# Patient Record
Sex: Male | Born: 1935 | Race: White | Hispanic: No | Marital: Married | Smoking: Former smoker
Health system: Southern US, Community
[De-identification: ages and names within clinical notes are randomized; demographics above are authoritative.]

## PROBLEM LIST (undated history)

## (undated) DIAGNOSIS — D649 Anemia, unspecified: Secondary | ICD-10-CM

## (undated) DIAGNOSIS — I2510 Atherosclerotic heart disease of native coronary artery without angina pectoris: Secondary | ICD-10-CM

## (undated) DIAGNOSIS — I10 Essential (primary) hypertension: Secondary | ICD-10-CM

## (undated) DIAGNOSIS — H409 Unspecified glaucoma: Secondary | ICD-10-CM

## (undated) DIAGNOSIS — I219 Acute myocardial infarction, unspecified: Secondary | ICD-10-CM

## (undated) DIAGNOSIS — I509 Heart failure, unspecified: Secondary | ICD-10-CM

## (undated) DIAGNOSIS — Z972 Presence of dental prosthetic device (complete) (partial): Secondary | ICD-10-CM

## (undated) DIAGNOSIS — C801 Malignant (primary) neoplasm, unspecified: Secondary | ICD-10-CM

## (undated) DIAGNOSIS — J449 Chronic obstructive pulmonary disease, unspecified: Secondary | ICD-10-CM

## (undated) DIAGNOSIS — I499 Cardiac arrhythmia, unspecified: Secondary | ICD-10-CM

## (undated) DIAGNOSIS — K219 Gastro-esophageal reflux disease without esophagitis: Secondary | ICD-10-CM

## (undated) HISTORY — PX: CORONARY ANGIOPLASTY: SHX604

## (undated) HISTORY — PX: TONSILLECTOMY: SUR1361

## (undated) HISTORY — DX: Chronic obstructive pulmonary disease, unspecified: J44.9

---

## 2012-04-16 ENCOUNTER — Other Ambulatory Visit: Payer: Self-pay | Admitting: Ophthalmology

## 2012-04-17 ENCOUNTER — Ambulatory Visit: Payer: Self-pay | Admitting: Ophthalmology

## 2012-05-22 ENCOUNTER — Inpatient Hospital Stay: Payer: Self-pay

## 2012-05-22 LAB — DIFFERENTIAL
Comment - H1-Com1: NORMAL
Comment - H1-Com2: NORMAL
Lymphocytes: 23 %
Segmented Neutrophils: 63 %

## 2012-05-22 LAB — BASIC METABOLIC PANEL
Anion Gap: 6 — ABNORMAL LOW (ref 7–16)
Calcium, Total: 8.9 mg/dL (ref 8.5–10.1)
Co2: 26 mmol/L (ref 21–32)
EGFR (African American): >60
EGFR (Non-African Amer.): >60
Glucose: 138 mg/dL — ABNORMAL HIGH (ref 65–99)
Osmolality: 284 (ref 275–301)
Potassium: 3.6 mmol/L (ref 3.5–5.1)

## 2012-05-22 LAB — PRO B NATRIURETIC PEPTIDE: B-Type Natriuretic Peptide: 2205 pg/mL — ABNORMAL HIGH (ref 0–450)

## 2012-05-22 LAB — CBC
HCT: 37.4 % — ABNORMAL LOW (ref 40.0–52.0)
HGB: 12.2 g/dL — ABNORMAL LOW (ref 13.0–18.0)
MCH: 31 pg (ref 26.0–34.0)
Platelet: 300 10*3/uL (ref 150–440)
RBC: 3.95 10*6/uL — ABNORMAL LOW (ref 4.40–5.90)
RDW: 12.8 % (ref 11.5–14.5)
WBC: 21 10*3/uL — ABNORMAL HIGH (ref 3.8–10.6)

## 2012-05-22 LAB — TROPONIN I: Troponin-I: <0.02 ng/mL

## 2012-05-23 LAB — CBC WITH DIFFERENTIAL/PLATELET
Basophil #: 0 10*3/uL (ref 0.0–0.1)
Basophil %: 0.2 %
Eosinophil #: 0.2 10*3/uL (ref 0.0–0.7)
HCT: 34 % — ABNORMAL LOW (ref 40.0–52.0)
Lymphocyte %: 17.9 %
MCHC: 34.7 g/dL (ref 32.0–36.0)
Monocyte %: 11.4 %
Neutrophil %: 69.2 %
Platelet: 260 10*3/uL (ref 150–440)
RDW: 12.9 % (ref 11.5–14.5)
WBC: 16.5 10*3/uL — ABNORMAL HIGH (ref 3.8–10.6)

## 2012-05-23 LAB — LIPID PANEL
HDL Cholesterol: 31 mg/dL — ABNORMAL LOW (ref 40–60)
Ldl Cholesterol, Calc: 55 mg/dL (ref 0–100)
Triglycerides: 193 mg/dL (ref 0–200)
VLDL Cholesterol, Calc: 39 mg/dL (ref 5–40)

## 2012-05-23 LAB — BASIC METABOLIC PANEL
Anion Gap: 8 (ref 7–16)
BUN: 21 mg/dL — ABNORMAL HIGH (ref 7–18)
Chloride: 102 mmol/L (ref 98–107)
Co2: 28 mmol/L (ref 21–32)
Creatinine: 1.14 mg/dL (ref 0.60–1.30)
EGFR (African American): >60
Osmolality: 279 (ref 275–301)
Potassium: 3.3 mmol/L — ABNORMAL LOW (ref 3.5–5.1)

## 2012-05-23 LAB — CK TOTAL AND CKMB (NOT AT ARMC)
CK, Total: 32 U/L — ABNORMAL LOW (ref 35–232)
CK-MB: 0.6 ng/mL (ref 0.5–3.6)
CK-MB: 0.6 ng/mL (ref 0.5–3.6)

## 2012-05-24 LAB — BASIC METABOLIC PANEL
Anion Gap: 13 (ref 7–16)
BUN: 14 mg/dL (ref 7–18)
Creatinine: 1.06 mg/dL (ref 0.60–1.30)
EGFR (African American): >60
EGFR (Non-African Amer.): >60
Glucose: 114 mg/dL — ABNORMAL HIGH (ref 65–99)
Osmolality: 275 (ref 275–301)
Potassium: 3.2 mmol/L — ABNORMAL LOW (ref 3.5–5.1)

## 2012-05-28 LAB — CULTURE, BLOOD (SINGLE)

## 2012-06-02 ENCOUNTER — Ambulatory Visit: Payer: Self-pay | Admitting: Internal Medicine

## 2012-06-10 DIAGNOSIS — H409 Unspecified glaucoma: Secondary | ICD-10-CM | POA: Insufficient documentation

## 2014-05-22 ENCOUNTER — Observation Stay: Payer: Self-pay | Admitting: Internal Medicine

## 2014-05-22 LAB — COMPREHENSIVE METABOLIC PANEL
ALT: 37 U/L (ref 12–78)
Albumin: 3.7 g/dL (ref 3.4–5.0)
Alkaline Phosphatase: 97 U/L
Anion Gap: 7 (ref 7–16)
BUN: 12 mg/dL (ref 7–18)
Bilirubin,Total: 0.4 mg/dL (ref 0.2–1.0)
CALCIUM: 9.3 mg/dL (ref 8.5–10.1)
CO2: 26 mmol/L (ref 21–32)
CREATININE: 1.06 mg/dL (ref 0.60–1.30)
Chloride: 107 mmol/L (ref 98–107)
EGFR (Non-African Amer.): >60
Glucose: 102 mg/dL — ABNORMAL HIGH (ref 65–99)
OSMOLALITY: 279 (ref 275–301)
Potassium: 3.5 mmol/L (ref 3.5–5.1)
SGOT(AST): 36 U/L (ref 15–37)
Sodium: 140 mmol/L (ref 136–145)
Total Protein: 8.5 g/dL — ABNORMAL HIGH (ref 6.4–8.2)

## 2014-05-22 LAB — CBC
HCT: 42.4 % (ref 40.0–52.0)
HGB: 14 g/dL (ref 13.0–18.0)
MCH: 31.1 pg (ref 26.0–34.0)
MCHC: 33 g/dL (ref 32.0–36.0)
MCV: 94 fL (ref 80–100)
Platelet: 264 10*3/uL (ref 150–440)
RBC: 4.5 10*6/uL (ref 4.40–5.90)
RDW: 13.4 % (ref 11.5–14.5)
WBC: 12.2 10*3/uL — AB (ref 3.8–10.6)

## 2014-05-22 LAB — PROTIME-INR
INR: 1
Prothrombin Time: 13.1 secs (ref 11.5–14.7)

## 2014-05-22 LAB — CK-MB
CK-MB: 0.9 ng/mL (ref 0.5–3.6)
CK-MB: 0.9 ng/mL (ref 0.5–3.6)

## 2014-05-22 LAB — TROPONIN I
Troponin-I: 0.04 ng/mL
Troponin-I: 0.05 ng/mL

## 2014-05-22 LAB — PRO B NATRIURETIC PEPTIDE: B-TYPE NATIURETIC PEPTID: 104 pg/mL (ref 0–450)

## 2014-05-23 LAB — LIPID PANEL
CHOLESTEROL: 136 mg/dL (ref 0–200)
HDL Cholesterol: 24 mg/dL — ABNORMAL LOW (ref 40–60)
Triglycerides: 400 mg/dL — ABNORMAL HIGH (ref 0–200)

## 2014-05-23 LAB — TROPONIN I: Troponin-I: 0.05 ng/mL

## 2014-05-23 LAB — CK-MB: CK-MB: 0.8 ng/mL (ref 0.5–3.6)

## 2014-05-24 LAB — CBC WITH DIFFERENTIAL/PLATELET
BASOS PCT: 0.6 %
Basophil #: 0.1 10*3/uL (ref 0.0–0.1)
Eosinophil #: 0.4 10*3/uL (ref 0.0–0.7)
Eosinophil %: 3.2 %
HCT: 41.8 % (ref 40.0–52.0)
HGB: 14.2 g/dL (ref 13.0–18.0)
Lymphocyte #: 5.2 10*3/uL — ABNORMAL HIGH (ref 1.0–3.6)
Lymphocyte %: 42.7 %
MCH: 31.3 pg (ref 26.0–34.0)
MCHC: 34 g/dL (ref 32.0–36.0)
MCV: 92 fL (ref 80–100)
MONO ABS: 1.1 x10 3/mm — AB (ref 0.2–1.0)
Monocyte %: 9 %
NEUTROS ABS: 5.4 10*3/uL (ref 1.4–6.5)
NEUTROS PCT: 44.5 %
PLATELETS: 241 10*3/uL (ref 150–440)
RBC: 4.54 10*6/uL (ref 4.40–5.90)
RDW: 13 % (ref 11.5–14.5)
WBC: 12.2 10*3/uL — AB (ref 3.8–10.6)

## 2014-05-24 LAB — BASIC METABOLIC PANEL
Anion Gap: 10 (ref 7–16)
BUN: 17 mg/dL (ref 7–18)
Calcium, Total: 8.9 mg/dL (ref 8.5–10.1)
Chloride: 104 mmol/L (ref 98–107)
Co2: 24 mmol/L (ref 21–32)
Creatinine: 1.13 mg/dL (ref 0.60–1.30)
EGFR (Non-African Amer.): >60
GLUCOSE: 94 mg/dL (ref 65–99)
Osmolality: 277 (ref 275–301)
Potassium: 3.5 mmol/L (ref 3.5–5.1)
SODIUM: 138 mmol/L (ref 136–145)

## 2014-05-24 LAB — CK-MB: CK-MB: 1.5 ng/mL (ref 0.5–3.6)

## 2014-05-25 LAB — BASIC METABOLIC PANEL
Anion Gap: 9 (ref 7–16)
BUN: 20 mg/dL — ABNORMAL HIGH (ref 7–18)
CALCIUM: 9 mg/dL (ref 8.5–10.1)
CHLORIDE: 104 mmol/L (ref 98–107)
Co2: 25 mmol/L (ref 21–32)
Creatinine: 1.21 mg/dL (ref 0.60–1.30)
GFR CALC NON AF AMER: 57 — AB
GLUCOSE: 99 mg/dL (ref 65–99)
Osmolality: 278 (ref 275–301)
POTASSIUM: 3.5 mmol/L (ref 3.5–5.1)
Sodium: 138 mmol/L (ref 136–145)

## 2015-03-12 NOTE — H&P (Signed)
PATIENT NAME:  Derek Haney, Derek Haney MR#:  [id number] DATE OF BIRTH:  August 08, 1933  DATE OF ADMISSION:  05/22/2014  REFERRING PHYSICIAN: Dr. Cinda Quest  PRIMARY CARE PHYSICIAN: Emily Filbert, [facility].   CHIEF COMPLAINT: Chest pain.   HISTORY OF PRESENT ILLNESS: A 81 year old Caucasian gentleman with history of coronary artery disease with prior myocardial infarction, in fact, approximately 30 years ago. He states he had balloon angioplasty, however, no stent placement, no bypass surgery, as well as history of hypertension, presenting with chest pain. Describes three day duration intermittent chest pain, retrosternal in location and burning in quality, 7 to 8 out of 10 in intensity, nonradiating. No worsening or relieving factors. Associated with minimal shortness of breath described as dyspnea on exertion. No nausea, vomiting, further symptomatology. Currently, he is chest pain free without complaints.   REVIEW OF SYSTEMS:  CONSTITUTIONAL: Denies fevers, chills, fatigue, weakness.  EYES: Denies blurred vision, double vision, eye pain.  EARS, NOSE, THROAT: Denies tinnitus, ear pain, hearing loss.  RESPIRATORY: Denies cough, wheeze. Positive for shortness of breath as described above.  CARDIOVASCULAR: Positive for chest pain as described above. Denies any orthopnea, edema, palpitations.  GASTROINTESTINAL: Denies nausea, vomiting, diarrhea, abdominal pain.  GENITOURINARY: Denies dysuria, hematuria.  ENDOCRINE: Denies nocturia or thyroid problems.  HEMATOLOGIC AND LYMPHATIC: Denies easy bruising, bleeding.  SKIN: Denies rash or lesion.  MUSCULOSKELETAL: Denies pain in neck, back, shoulder, knees, hips or arthritic symptoms.  NEUROLOGIC: Denies paralysis, paresthesias.  PSYCHIATRIC: Denies anxiety or depressive symptoms.  Otherwise, full review of systems performed by me and is negative.   PAST MEDICAL HISTORY: Hypertension, coronary artery disease, with history of myocardial infarction about 34  years ago, treated only with balloon angioplasty, no stent placement, no bypass procedures, as well as lumbago.   SOCIAL HISTORY: Remote tobacco use. Denies any alcohol, drug use.   FAMILY HISTORY: Positive for coronary artery disease as well as lymphoma.   ALLERGIES: PENICILLIN.   HOME MEDICATIONS: Include nitroglycerin 0.4 mg sublingual every five minutes as needed for chest pain, propranolol 40 mg p.o. 4 times daily, trazodone 50 mg 2 tablets p.o. at bedtime as needed for sleep, nifedipine 10 mg 2 capsules p.o. 4 times daily, Lasix 40 mg p.o. daily, Nasonex 50 mcg inhalation two sprays once daily, dorzolamide 2% ophthalmic solution one drop to each eye daily, Lumigan 0.01% ophthalmic solution one drop to each eye twice daily.    PHYSICAL EXAMINATION: VITAL SIGNS: Temperature 98.2, heart rate 65, respirations 18, blood pressure 151/80, saturating 95 on room air. Weight 90.7 kg, BMI 32.3.  GENERAL: Well-nourished, well-developed, Caucasian gentleman, currently in no acute distress.  HEAD: Normocephalic, atraumatic.  EYES: Pupils equal, round, reactive to light. Extraocular muscles are intact. No scleral icterus.  MOUTH: Moist mucous membranes. Dentition intact. No abscess noted.  EARS, NOSE, AND THROAT: Clear, without exudates. No external lesions.  NECK: Supple. No thyromegaly. No nodules. No JVD.  PULMONARY: Clear to auscultation No wheezes, rubs or rhonchi. No use of accessory muscles. Good respiratory effort.  CHEST: Nontender to palpation.  CARDIOVASCULAR: S1, S2. Regular rate and rhythm. No murmurs, rubs, or gallops. No edema. Pedal pulses two 2+ bilaterally.  GASTROINTESTINAL: Soft, nontender, nondistended. No masses. Positive bowel sounds. No hepatosplenomegaly. MUSCULOSKELETAL: No swelling, clubbing, or edema. Range of motion full in all extremities.  NEUROLOGIC: Cranial nerves II to XII intact. No gross focal neurological deficits. Sensation intact. Reflexes intact.  SKIN: No  ulceration, lesions, rashes, or cyanosis. Skin warm, dry. Turgor intact.  PSYCHIATRIC: Mood and  affect within normal limits. The patient is alert and oriented x3. Insight and judgment intact.   LABORATORY DATA: EKG performed, revealing right bundle branch block with anterior T-wave inversion, which is unchanged from prior EKG performed back in 2013. Chest x-ray: No acute cardiopulmonary process. Sodium 140, potassium 3.5, chloride 107, bicarbonate 26, BUN 12, creatinine 1.06, glucose 102, total protein 8.5. Otherwise LFTs within normal limits. Troponin I is 0.04. WBC 12.2, hemoglobin 14, platelets of 264.   ASSESSMENT AND PLAN: A 81 year old gentleman with history of coronary artery disease, presenting with chest pain.  1.  Chest pain. Admit to telemetry under observational status. Trend cardiac enzymes x3. Initiate aspirin and statin therapy. This is possibly gastrointestinal in etiology. We will start a proton pump inhibitor as well.  2.  Leukocytosis. No evidence of infection. No antibiotic required at this time. 3.  Hypertension. Continue home doses of nifedipine and propranolol.  4.  Venous thromboembolism prophylaxis with heparin subcutaneous.   CODE STATUS: The patient is full code.   TIME SPENT: 45 minutes.   ____________________________ Aaron Mose. Ayahna Solazzo, MD dkh:cg D: 05/22/2014 20:54:21 ET T: 05/22/2014 23:12:01 ET JOB#: [id number]  cc: Aaron Mose. Larya Charpentier, MD, <Dictator> Ayanah Snader Woodfin Ganja MD ELECTRONICALLY SIGNED 05/25/2014 1:48

## 2015-03-12 NOTE — Consult Note (Signed)
PATIENT NAME:  Derek Haney, Derek Haney MR#:  [id number] DATE OF BIRTH:  08-30-1933  DATE OF CONSULTATION:  05/23/2014  REFERRING PHYSICIAN:  Dr. Ether Griffins CONSULTING PHYSICIAN:  Corey Skains, MD  REASON FOR CONSULTATION: Unstable angina.  HISTORY OF PRESENT ILLNESS: This is a 81 year old male with known coronary disease status post previous coronary artery stenting, old myocardial infarction with evidence of significant substernal chest pain waxing and waning over the last 3-4 days, relieved by nitroglycerin, associated with shortness of breath, weakness, and fatigue. He was seen in the Emergency Room with an EKG of normal sinus rhythm right bundle branch block, but no evidence of myocardial infarction with troponin being normal. He does have some other borderline hypertension, hyperlipidemia, appropriately treated. The remainder of review of systems negative for vision change, ringing in the ears, hearing loss, cough, congestion, heartburn, nausea, vomiting, diarrhea, bloody stools, stomach pain, extremity pain, leg weakness, cramping of the buttocks, known blood clots, headaches, blackouts, dizzy spells, nosebleeds, congestion, trouble swallowing, frequent urination, urination at night, muscle weakness, numbness, anxiety, depression, skin lesions, skin rash.   PAST MEDICAL HISTORY:  1. Old myocardial infarction. 2. Hypertension.  3. Hyperlipidemia.  4. Coronary artery disease.   FAMILY HISTORY: There are multiple family members with early onset of cardiovascular disease or hypertension.   SOCIAL HISTORY: He currently denies alcohol or tobacco use.   ALLERGIES: HE HAS ALLERGIES TO MEDICATIONS AS LISTED.   PHYSICAL EXAMINATION:  VITAL SIGNS: Blood pressure is 144/68 bilaterally. Heart rate 72 upright, reclining, and regular.  GENERAL: He is a well-appearing male in no acute distress.   HEENT: No icterus, thyromegaly, ulcers, hemorrhage, or xanthelasma.  CARDIOVASCULAR: Regular rate and rhythm.  Normal S1 and S2 without murmur, gallop, or rub. PMI is diffuse. Carotid upstroke normal without bruit. Jugular venous pressure is normal.  LUNGS: Clear to auscultation with normal respirations.  ABDOMEN: Soft, nontender without hepatosplenomegaly or masses. Abdominal aorta is normal size without bruit.  EXTREMITIES:  Show 2+ bilateral pulses in dorsal, pedal, radial, and femoral arteries without lower extremity edema, cyanosis, clubbing or ulcers.  NEUROLOGICAL: He is oriented to time, place, and person with normal mood and affect.   ASSESSMENT: Elderly male with known coronary artery disease, previous myocardial infarction, hypertension, hyperlipidemia with unstable angina, and abnormal EKG.   RECOMMENDATIONS:  1. Continue serial ECG and enzymes to assess for a possible myocardial infarction.  2. Continue nitroglycerin, heparin as needed for cardiovascular risk reduction.  3. Further consideration of cardiac catheterization to assess coronary anatomy and further treatment thereof as necessary. The patient understands the risks and benefits of cardiac catheterization. This includes possibility of death, stroke, heart attack, infection, bleeding or blood clot. He is at low risk for conscious sedation.    ____________________________ Corey Skains, MD bjk:dd D: 05/23/2014 22:30:34 ET T: 05/24/2014 02:31:21 ET JOB#: [id number]  cc: Corey Skains, MD, <Dictator> Corey Skains MD ELECTRONICALLY SIGNED 05/24/2014 13:23

## 2015-03-12 NOTE — Discharge Summary (Signed)
PATIENT NAME:  Derek Haney, Derek Haney MR#:  [id number] DATE OF BIRTH:  05-Apr-1933  DATE OF ADMISSION:  05/22/2014 DATE OF DISCHARGE:  05/25/2014  DISCHARGE DIAGNOSES: 1.  Unstable angina.  2.  Hypertension.  3.  Coronary artery disease.  4.  Glaucoma.   DISCHARGE MEDICATIONS: 1.  Trazodone 50 mg at bedtime. 2.  Dorzolamide 2% ophthalmic 1 drop both eyes b.i.d. 3.  Lumigan 0.01% one drop both eyes at bedtime. 4.  Restasis 0.05% one drop both eyes b.i.d. 5.  Propranolol 40 mg half tab q.i.d.  6.  Lasix 40 mg half tab daily. 7.  Aspirin 81 mg daily. 8.  Atorvastatin 20 mg at bedtime.  9.  Ticagrelor 90 mg b.i.d. 10.  Temazepam 15 mg at bedtime p.r.n.  11.  Nifedipine 20 mg q.i.d.  REASON FOR ADMISSION: A 81 year old male who presents with chest pain. Please see H and P for HPI, past medical history and physical exam.   HOSPITAL COURSE: The patient was admitted, was found to have typical angina and underwent heart catheterization showing 100% RCA with collateralization and a proximal circumflex as well as a proximal LAD lesion. The LAD underwent PCI without complication. He is asymptomatic at this point and will follow up with Dr. Nehemiah Massed and Dr. Sabra Heck in 2 weeks. He will need cardiac rehab at that point.   ____________________________ Rusty Aus, MD mfm:sb D: 05/25/2014 07:35:06 ET T: 05/25/2014 08:41:26 ET JOB#: [id number]  cc: Rusty Aus, MD, <Dictator> Marlana Mckowen Roselee Culver MD ELECTRONICALLY SIGNED 05/26/2014 7:05

## 2015-03-13 NOTE — Discharge Summary (Signed)
PATIENT NAME:  Derek Haney, Derek Haney MR#:  [id number] DATE OF BIRTH:  1933-01-04  DATE OF ADMISSION:  05/22/2012 DATE OF DISCHARGE:  05/24/2012  PRIMARY CARE PROVIDER: Emily Filbert, MD   DISCHARGE DIAGNOSES:  1. Diastolic congestive heart failure.  2. Chronic sinusitis.  3. Hypertension.  4. Chronic back pain.   HISTORY OF PRESENT ILLNESS: This is a 81 year old male with a history of hypertension who presented to the ED with increasing shortness of breath. He had been treated with a prednisone taper as well as one course of antibiotics for chronic sinusitis. Despite this therapy, shortness of breath had increased. He was found to have mild hypoxia which improved with oxygen supplementation. He had an abnormal chest x-ray suggestive of pulmonary vascular congestion and an elevated BNP. He was admitted with a diagnosis of heart failure.   HOSPITAL COURSE: The patient was treated with IV followed by oral Lasix. His outpatient course of antibiotics was continued. He had previously undergone an echocardiogram showing ejection fraction of 60% so no echo was repeated. He had good symptomatic improvement with the Lasix. Labs were otherwise fairly unremarkable and, of note, his cardiac enzymes remained normal. A repeat BNP on the day of discharge had improved to just 271.   DISCHARGE MEDICATIONS:  1. Clarithromycin 500 mg twice daily for 21 days, started 05/16/2012.  2. Dorzolamide 2% ophthalmic drops one GTT OU daily.  3. Lumigan 0.01% ophthalmic drops one GTT OU b.i.d.  4. Nasonex 50 mcg two sprays daily.  5. Nifedipine 10 mg 2 capsules q.i.d.  6. Nitroglycerin 0.4 mg sublingual as needed for chest pain.  7. Propranolol 40 mg q.i.d.  8. Trazodone 50 mg 2 tabs at bedtime.  9. Furosemide 20 mg daily (new medication).   DISCHARGE INSTRUCTIONS:  1. The patient should be followed up with Dr. Emily Filbert within the next week. He will be called with a date and time of his appointment.  2. The patient should  weigh himself daily and notify his physician if his weight increases by more than 2 pounds in one day or more than 5 pounds in one week.   ____________________________ A. Lavone Orn, MD ams:drc D: 05/24/2012 09:35:23 ET T: 05/26/2012 11:07:07 ET JOB#: [id number]  cc: A. Lavone Orn, MD, <Dictator> Rusty Aus, MD Gracy Bruins SOLUM MD ELECTRONICALLY SIGNED 05/28/2012 12:45

## 2015-03-13 NOTE — H&P (Signed)
PATIENT NAME:  Derek Haney, Derek Haney MR#:  [id number] DATE OF BIRTH:  [birth year]  DATE OF ADMISSION:  05/22/2012  PRIMARY CARE PHYSICIAN: Dr. Emily Filbert  CHIEF COMPLAINT: Increasing shortness of breath.   HISTORY OF PRESENT ILLNESS: Derek Haney is a pleasant 81 year old Caucasian gentleman with past medical history of hypertension, chronic low back pain comes to the Emergency Room after he started noticing increasing shortness of breath for past 3 to 4 weeks. Initially was thought he has chronic sinusitis. Saw Dr. Nadeen Landau 06/28 and was started on clarithromycin to complete a 21 day course. Patient is still on clarithromycin. He just finished a prednisone taper as part of the chronic sinusitis treatment. Patient continues to have increasing shortness of breath while at home, increased fatigability and comes to the Emergency Room where he was found to have acute congestive heart failure, suspected diastolic, new onset. He is being admitted for further infection and management. Patient denies any chest pain, any fever or any productive cough. He denies any dysuria or any abdominal pain. He received IV Lasix 20 mg.  PAST MEDICAL HISTORY: 1. Chronic low back pain.  2. Hypertension.  3. Questionable MI in the 44s.   PAST SURGICAL HISTORY: Tonsillectomy and adenoidectomy.    ALLERGIES: Penicillin.   MEDICATIONS:  1. Clarithromycin 500 mg twice a day for 21 days, started 05/16/2012 by Dr. Loletta Specter.  2. Dorzolamide 2% ophthalmic drops one drop to each eye daily.  3. Lumigan 0.01% one drop to each eye b.i.d.  4. Nasonex 50 mcg two sprays once a day in the evening.  5. Nifedipine 10 mg 2 capsules 4 times a day.  6. Nitroglycerin 0.4 mg one tablet p.r.n. for chest pain.  7. Propranolol 40 mg 4 times a day.  8. Trazodone 50 mg 2 tablets at bedtime.   FAMILY HISTORY: Father died of lymph node cancer.   SOCIAL HISTORY: Married, has three boys. Retired from [employer]. Stopped smoking in  the 80s. Chews tobacco now.    REVIEW OF SYSTEMS: CONSTITUTIONAL: No fever. Positive fatigue, weakness. EYES: No blurred or double vision. Positive for glaucoma. ENT: Positive for chronic sinusitis. No ear pain or ear discharge. RESPIRATORY: Positive for shortness of breath. No chronic obstructive pulmonary disease or pneumonia. CARDIOVASCULAR: No chest pain. Positive for edema. Positive for dyspnea on exertion and blood pressure. GASTROINTESTINAL: No nausea, vomiting, diarrhea, abdominal pain or gastroesophageal reflux disease. GENITOURINARY: No dysuria, hematuria. ENDOCRINE: No polyuria or nocturia. HEMATOLOGY: No anemia or easy bruising. SKIN: No acne, rash. MUSCULOSKELETAL: Positive for arthritis. NEUROLOGIC: No cerebrovascular accident, transient ischemic attack. PSYCH: No anxiety or depression. All other systems reviewed and negative.   PHYSICAL EXAMINATION:  GENERAL: Patient is awake, alert, oriented x3, not in acute distress.   VITAL SIGNS: Afebrile, pulse 71, blood pressure 128/63, sats 99% on 2 liters.   HEENT: Atraumatic, normocephalic. Pupils are equal, round, and reactive to light and accommodation. Extraocular movements intact. Oral mucosa is moist.   NECK: Supple. No JVD. No carotid bruit.   RESPIRATORY: Decreased breath sounds in the bases. No rales, rhonchi, respiratory distress, or labored breathing.   CARDIOVASCULAR: Both the heart sounds are normal. PMI not lateralized. Chest nontender.   EXTREMITIES: Good pedal pulses, good femoral pulses. No lower extremity edema.   ABDOMEN: Obese, soft, nontender. No organomegaly. Positive bowel sounds.   NEUROLOGIC: Grossly intact cranial nerves II through XII. No motor or sensory deficits.   PSYCH: Patient is awake, alert, oriented x3.   LABORATORY, DIAGNOSTIC AND  RADIOLOGICAL DATA: EKG shows normal sinus rhythm. Chest x-ray consistent with pulmonary vascular congestion. B-type natriuretic peptide 2200. Cardiac enzymes, first set,  negative. White count 21,000, hemoglobin and hematocrit 12 and 37.4, platelet count normal. Metabolic panel within normal limits.   ASSESSMENT AND PLAN: 81 year old Derek Haney with:  1. New onset congestive heart failure, acute, diastolic. Outpatient echo done July 2013 shows ejection fraction of 60% with elevated RV pressure. Patient will be admitted under Dr. Sabra Heck on 2A. 2 grams sodium diet. Will continue IV Lasix b.i.d., ins and outs, daily weights. Continue rest of home medication. Consider cardiology consultation if needed. Cycle cardiac enzymes x3.  2. Chronic sinusitis. Just finished a prednisone taper. Currently on clarithromycin for 21 days. Will continue clarithromycin.  3. Hypertension. Patient takes nifedipine and propranolol which I will continue for now.  4. History of myocardial infarction in the past. Denies any chest pain. First set of cardiac enzymes are negative. Will cycle two more cardiac enzymes.  5. Glaucoma. Continue Lumigan and dorzolamide.  6. Leukocytosis, appears reactive. Patient recently also on steroids. He does not have any source of infection, however, if persistent further work-up needs to be done. Patient already is on clarithromycin as well.  7. CODE STATUS: Patient is a FULL CODE.  8. Further work-up according to patient's clinical course. Hospital admission plan was discussed with the patient and wife.   TIME SPENT: 50 minutes.   ____________________________ Derek Rochester Posey Pronto, MD sap:cms D: 05/22/2012 20:43:16 ET T: 05/23/2012 09:16:06 ET JOB#: [id number]  cc: Donaciano Range A. Posey Pronto, MD, <Dictator> Rusty Aus, MD Ilda Basset MD ELECTRONICALLY SIGNED 05/23/2012 17:13

## 2015-04-15 DIAGNOSIS — I10 Essential (primary) hypertension: Secondary | ICD-10-CM | POA: Insufficient documentation

## 2016-03-05 ENCOUNTER — Other Ambulatory Visit: Payer: Self-pay | Admitting: Orthopedic Surgery

## 2016-03-07 ENCOUNTER — Encounter
Admission: RE | Admit: 2016-03-07 | Discharge: 2016-03-07 | Disposition: A | Discharge status: Home / Self Care | Payer: Medicare Other | Source: Ambulatory Visit | Attending: Orthopedic Surgery | Admitting: Orthopedic Surgery

## 2016-03-07 DIAGNOSIS — Z01812 Encounter for preprocedural laboratory examination: Secondary | ICD-10-CM | POA: Insufficient documentation

## 2016-03-07 HISTORY — DX: Heart failure, unspecified: I50.9

## 2016-03-07 HISTORY — DX: Anemia, unspecified: D64.9

## 2016-03-07 HISTORY — DX: Cardiac arrhythmia, unspecified: I49.9

## 2016-03-07 HISTORY — DX: Malignant (primary) neoplasm, unspecified: C80.1

## 2016-03-07 HISTORY — DX: Essential (primary) hypertension: I10

## 2016-03-07 HISTORY — DX: Gastro-esophageal reflux disease without esophagitis: K21.9

## 2016-03-07 HISTORY — DX: Unspecified glaucoma: H40.9

## 2016-03-07 HISTORY — DX: Atherosclerotic heart disease of native coronary artery without angina pectoris: I25.10

## 2016-03-07 HISTORY — DX: Acute myocardial infarction, unspecified: I21.9

## 2016-03-07 LAB — COMPREHENSIVE METABOLIC PANEL
ALBUMIN: 4.4 g/dL (ref 3.5–5.0)
ALK PHOS: 110 U/L (ref 38–126)
ALT: 18 U/L (ref 17–63)
ANION GAP: 8 (ref 5–15)
AST: 23 U/L (ref 15–41)
BILIRUBIN TOTAL: 1.1 mg/dL (ref 0.3–1.2)
BUN: 17 mg/dL (ref 6–20)
CALCIUM: 9.7 mg/dL (ref 8.9–10.3)
CO2: 29 mmol/L (ref 22–32)
Chloride: 101 mmol/L (ref 101–111)
Creatinine, Ser: 1.36 mg/dL — ABNORMAL HIGH (ref 0.61–1.24)
GFR calc Af Amer: 55 mL/min — ABNORMAL LOW (ref >60)
GFR, EST NON AFRICAN AMERICAN: 47 mL/min — AB (ref >60)
GLUCOSE: 106 mg/dL — AB (ref 65–99)
Potassium: 4 mmol/L (ref 3.5–5.1)
Sodium: 138 mmol/L (ref 135–145)
TOTAL PROTEIN: 8.3 g/dL — AB (ref 6.5–8.1)

## 2016-03-07 LAB — URINALYSIS COMPLETE WITH MICROSCOPIC (ARMC ONLY)
BILIRUBIN URINE: NEGATIVE
Bacteria, UA: NONE SEEN
GLUCOSE, UA: NEGATIVE mg/dL
HGB URINE DIPSTICK: NEGATIVE
Ketones, ur: NEGATIVE mg/dL
LEUKOCYTES UA: NEGATIVE
Nitrite: NEGATIVE
Protein, ur: NEGATIVE mg/dL
Specific Gravity, Urine: 1.017 (ref 1.005–1.030)
pH: 5 (ref 5.0–8.0)

## 2016-03-07 LAB — CBC
HEMATOCRIT: 38.1 % — AB (ref 40.0–52.0)
HEMOGLOBIN: 13 g/dL (ref 13.0–18.0)
MCH: 30.1 pg (ref 26.0–34.0)
MCHC: 34.2 g/dL (ref 32.0–36.0)
MCV: 88 fL (ref 80.0–100.0)
Platelets: 250 10*3/uL (ref 150–440)
RBC: 4.33 MIL/uL — ABNORMAL LOW (ref 4.40–5.90)
RDW: 14.3 % (ref 11.5–14.5)
WBC: 9.5 10*3/uL (ref 3.8–10.6)

## 2016-03-07 LAB — TYPE AND SCREEN
ABO/RH(D): B POS
Antibody Screen: NEGATIVE

## 2016-03-07 LAB — PROTIME-INR
INR: 1.07
PROTHROMBIN TIME: 14.1 s (ref 11.4–15.0)

## 2016-03-07 LAB — SURGICAL PCR SCREEN
MRSA, PCR: POSITIVE — AB
STAPHYLOCOCCUS AUREUS: POSITIVE — AB

## 2016-03-07 LAB — APTT: APTT: 30 s (ref 24–36)

## 2016-03-07 LAB — SEDIMENTATION RATE: SED RATE: 35 mm/h — AB (ref 0–20)

## 2016-03-07 NOTE — Patient Instructions (Signed)
  Your procedure is scheduled on:Mar 21, 2016 (Wednesday) Report to Day Surgery.([facility]) SECOND FLOOR To find out your arrival time please call [phone] between 1PM - 3PM on Mar 20, 2016 (Tuesday).  Remember: Instructions that are not followed completely may result in serious medical risk, up to and including death, or upon the discretion of your surgeon and anesthesiologist your surgery may need to be rescheduled.    __x__ 1. Do not eat food or drink liquids after midnight. No gum chewing or hard candies.     __x__ 2. No Alcohol for 24 hours before or after surgery.   ____ 3. Bring all medications with you on the day of surgery if instructed.    __x__ 4. Notify your doctor if there is any change in your medical condition     (cold, fever, infections).     Do not wear jewelry, make-up, hairpins, clips or nail polish.  Do not wear lotions, powders, or perfumes. You may wear deodorant.  Do not shave 48 hours prior to surgery. Men may shave face and neck.  Do not bring valuables to the hospital.    [facility] is not responsible for any belongings or valuables.               Contacts, dentures or bridgework may not be worn into surgery.  Leave your suitcase in the car. After surgery it may be brought to your room.  For patients admitted to the hospital, discharge time is determined by your                treatment team.   Patients discharged the day of surgery will not be allowed to drive home.   Please read over the following fact sheets that you were given:   MRSA Information and Surgical Site Infection Prevention   ____ Take these medicines the morning of surgery with A SIP OF WATER:    1. Isosorbide Mon  2. Propranolol  3. Pantoprazole (Pantoprazole at bedtime on May 2)  4.  5.  6.  ____ Fleet Enema (as directed)   _x___ Use CHG Soap as directed  ____ Use inhalers on the day of surgery  ____ Stop metformin 2 days prior to surgery    ____ Take 1/2 of usual  insulin dose the night before surgery and none on the morning of surgery.   __X__ Stop Coumadin/Plavix/aspirin on (ASK DR [place] ON Monday (April  24) AT OFFICE VISIT  __X__ Stop Anti-inflammatories on (NO NSAIDS)  TYLENOL OK TO TAKE FOR PAIN IF NEEDED)   __X_ Stop supplements until after surgery.  (STOP GLUCOSAMINE- CHONDROIT NOW)  ____ Bring C-Pap to the hospital.

## 2016-03-08 LAB — ABO/RH: ABO/RH(D): B POS

## 2016-03-08 NOTE — Pre-Procedure Instructions (Signed)
MRSA results faxed and called to Dr. Marry Guan office (spoke to [place]).

## 2016-03-09 LAB — URINE CULTURE: Special Requests: NORMAL

## 2016-03-19 NOTE — Pre-Procedure Instructions (Signed)
CLEARED BY DR Nehemiah Massed 03/12/16

## 2016-03-21 ENCOUNTER — Encounter: Payer: Self-pay | Admitting: *Deleted

## 2016-03-21 ENCOUNTER — Inpatient Hospital Stay: Payer: Medicare Other | Admitting: Anesthesiology

## 2016-03-21 ENCOUNTER — Inpatient Hospital Stay
Admission: RE | Admit: 2016-03-21 | Discharge: 2016-03-23 | DRG: 470 | Disposition: A | Discharge status: Skilled Nursing Facility | Payer: Medicare Other | Source: Ambulatory Visit | Attending: Orthopedic Surgery | Admitting: Orthopedic Surgery

## 2016-03-21 ENCOUNTER — Inpatient Hospital Stay: Payer: Medicare Other

## 2016-03-21 ENCOUNTER — Encounter
Admission: RE | Disposition: A | Discharge status: Skilled Nursing Facility | Payer: Self-pay | Source: Ambulatory Visit | Attending: Orthopedic Surgery

## 2016-03-21 DIAGNOSIS — K219 Gastro-esophageal reflux disease without esophagitis: Secondary | ICD-10-CM | POA: Diagnosis present

## 2016-03-21 DIAGNOSIS — M1611 Unilateral primary osteoarthritis, right hip: Secondary | ICD-10-CM | POA: Diagnosis present

## 2016-03-21 DIAGNOSIS — Z79899 Other long term (current) drug therapy: Secondary | ICD-10-CM

## 2016-03-21 DIAGNOSIS — Z880 Allergy status to penicillin: Secondary | ICD-10-CM

## 2016-03-21 DIAGNOSIS — I252 Old myocardial infarction: Secondary | ICD-10-CM | POA: Diagnosis not present

## 2016-03-21 DIAGNOSIS — J449 Chronic obstructive pulmonary disease, unspecified: Secondary | ICD-10-CM | POA: Diagnosis present

## 2016-03-21 DIAGNOSIS — I2510 Atherosclerotic heart disease of native coronary artery without angina pectoris: Secondary | ICD-10-CM | POA: Diagnosis present

## 2016-03-21 DIAGNOSIS — Z886 Allergy status to analgesic agent status: Secondary | ICD-10-CM | POA: Diagnosis not present

## 2016-03-21 DIAGNOSIS — Z7982 Long term (current) use of aspirin: Secondary | ICD-10-CM | POA: Diagnosis not present

## 2016-03-21 DIAGNOSIS — I110 Hypertensive heart disease with heart failure: Secondary | ICD-10-CM | POA: Diagnosis present

## 2016-03-21 DIAGNOSIS — H409 Unspecified glaucoma: Secondary | ICD-10-CM | POA: Diagnosis present

## 2016-03-21 DIAGNOSIS — I509 Heart failure, unspecified: Secondary | ICD-10-CM | POA: Diagnosis present

## 2016-03-21 DIAGNOSIS — Z96649 Presence of unspecified artificial hip joint: Secondary | ICD-10-CM

## 2016-03-21 HISTORY — PX: TOTAL HIP ARTHROPLASTY: SHX124

## 2016-03-21 SURGERY — ARTHROPLASTY, HIP, TOTAL,POSTERIOR APPROACH
Anesthesia: Spinal | Laterality: Right | Wound class: Clean

## 2016-03-21 MED ORDER — FENTANYL CITRATE (PF) 100 MCG/2ML IJ SOLN
INTRAMUSCULAR | Status: DC | PRN
  Administered 2016-03-21 (×2): 50 ug via INTRAVENOUS

## 2016-03-21 MED ORDER — FLEET ENEMA 7-19 GM/118ML RE ENEM: 1.0000 | ENEMA | Freq: Once | RECTAL | Status: DC | PRN

## 2016-03-21 MED ORDER — MUPIROCIN 2 % EX OINT
1.0000 "application " | TOPICAL_OINTMENT | Freq: Two times a day (BID) | CUTANEOUS | Status: DC
  Filled 2016-03-21: qty 22

## 2016-03-21 MED ORDER — LACTATED RINGERS IV SOLN
INTRAVENOUS | Status: DC
  Administered 2016-03-21 (×2): via INTRAVENOUS

## 2016-03-21 MED ORDER — FENTANYL CITRATE (PF) 100 MCG/2ML IJ SOLN
25.0000 ug | INTRAMUSCULAR | Status: DC | PRN
  Administered 2016-03-21 (×2): 25 ug via INTRAVENOUS

## 2016-03-21 MED ORDER — VANCOMYCIN HCL IN DEXTROSE 1-5 GM/200ML-% IV SOLN
INTRAVENOUS | Status: AC
  Administered 2016-03-21: 1000 mg via INTRAVENOUS
  Filled 2016-03-21: qty 200

## 2016-03-21 MED ORDER — LATANOPROST 0.005 % OP SOLN
1.0000 [drp] | Freq: Every day | OPHTHALMIC | Status: DC
  Filled 2016-03-21: qty 2.5

## 2016-03-21 MED ORDER — BISACODYL 10 MG RE SUPP
10.0000 mg | Freq: Every day | RECTAL | Status: DC | PRN
  Administered 2016-03-23: 10 mg via RECTAL
  Filled 2016-03-21: qty 1

## 2016-03-21 MED ORDER — ONDANSETRON HCL 4 MG/2ML IJ SOLN: 4.0000 mg | Freq: Four times a day (QID) | INTRAMUSCULAR | Status: DC | PRN

## 2016-03-21 MED ORDER — DIPHENHYDRAMINE HCL 12.5 MG/5ML PO ELIX: 12.5000 mg | ORAL_SOLUTION | ORAL | Status: DC | PRN

## 2016-03-21 MED ORDER — POTASSIUM CHLORIDE CRYS ER 20 MEQ PO TBCR
20.0000 meq | EXTENDED_RELEASE_TABLET | Freq: Every day | ORAL | Status: DC
  Administered 2016-03-21 – 2016-03-23 (×3): 20 meq via ORAL
  Filled 2016-03-21 (×3): qty 1

## 2016-03-21 MED ORDER — METOCLOPRAMIDE HCL 10 MG PO TABS
10.0000 mg | ORAL_TABLET | Freq: Three times a day (TID) | ORAL | Status: DC
  Administered 2016-03-21 – 2016-03-23 (×6): 10 mg via ORAL
  Filled 2016-03-21 (×6): qty 1

## 2016-03-21 MED ORDER — ATORVASTATIN CALCIUM 20 MG PO TABS
20.0000 mg | ORAL_TABLET | Freq: Every day | ORAL | Status: DC
  Administered 2016-03-21: 20 mg via ORAL
  Filled 2016-03-21: qty 1

## 2016-03-21 MED ORDER — TRAMADOL HCL 50 MG PO TABS
50.0000 mg | ORAL_TABLET | ORAL | Status: DC | PRN
  Administered 2016-03-21 – 2016-03-22 (×2): 50 mg via ORAL
  Administered 2016-03-23: 100 mg via ORAL
  Filled 2016-03-21: qty 2
  Filled 2016-03-21 (×2): qty 1

## 2016-03-21 MED ORDER — NEOMYCIN-POLYMYXIN B GU 40-200000 IR SOLN
Status: DC | PRN
  Administered 2016-03-21: 14 mL

## 2016-03-21 MED ORDER — ACETAMINOPHEN 10 MG/ML IV SOLN
INTRAVENOUS | Status: AC
  Filled 2016-03-21: qty 100

## 2016-03-21 MED ORDER — ACETAMINOPHEN 325 MG PO TABS: 650.0000 mg | ORAL_TABLET | Freq: Four times a day (QID) | ORAL | Status: DC | PRN

## 2016-03-21 MED ORDER — SODIUM CHLORIDE FLUSH 0.9 % IV SOLN
INTRAVENOUS | Status: AC
  Filled 2016-03-21: qty 10

## 2016-03-21 MED ORDER — PROPOFOL 500 MG/50ML IV EMUL
INTRAVENOUS | Status: DC | PRN
  Administered 2016-03-21: 50 ug/kg/min via INTRAVENOUS

## 2016-03-21 MED ORDER — DORZOLAMIDE HCL 2 % OP SOLN
1.0000 [drp] | Freq: Two times a day (BID) | OPHTHALMIC | Status: DC
  Filled 2016-03-21: qty 10

## 2016-03-21 MED ORDER — TRAZODONE HCL 100 MG PO TABS
100.0000 mg | ORAL_TABLET | Freq: Every day | ORAL | Status: DC
  Administered 2016-03-21 – 2016-03-22 (×2): 100 mg via ORAL
  Filled 2016-03-21 (×2): qty 1

## 2016-03-21 MED ORDER — OXYCODONE HCL 5 MG PO TABS
5.0000 mg | ORAL_TABLET | ORAL | Status: DC | PRN
  Administered 2016-03-21: 10 mg via ORAL
  Administered 2016-03-21: 5 mg via ORAL
  Administered 2016-03-22 – 2016-03-23 (×2): 10 mg via ORAL
  Filled 2016-03-21 (×3): qty 2
  Filled 2016-03-21: qty 1

## 2016-03-21 MED ORDER — ISOSORBIDE MONONITRATE ER 30 MG PO TB24
30.0000 mg | ORAL_TABLET | Freq: Three times a day (TID) | ORAL | Status: DC
  Administered 2016-03-21 – 2016-03-23 (×4): 30 mg via ORAL
  Filled 2016-03-21 (×6): qty 1

## 2016-03-21 MED ORDER — ACETAMINOPHEN 10 MG/ML IV SOLN
1000.0000 mg | Freq: Four times a day (QID) | INTRAVENOUS | Status: AC
  Administered 2016-03-21 – 2016-03-22 (×4): 1000 mg via INTRAVENOUS
  Filled 2016-03-21 (×4): qty 100

## 2016-03-21 MED ORDER — FUROSEMIDE 20 MG PO TABS
20.0000 mg | ORAL_TABLET | Freq: Every day | ORAL | Status: DC
  Administered 2016-03-21 – 2016-03-23 (×3): 20 mg via ORAL
  Filled 2016-03-21 (×3): qty 1

## 2016-03-21 MED ORDER — MENTHOL 3 MG MT LOZG: 1.0000 | LOZENGE | OROMUCOSAL | Status: DC | PRN

## 2016-03-21 MED ORDER — ALUM & MAG HYDROXIDE-SIMETH 200-200-20 MG/5ML PO SUSP: 30.0000 mL | ORAL | Status: DC | PRN

## 2016-03-21 MED ORDER — BUPIVACAINE HCL (PF) 0.75 % IJ SOLN
INTRAMUSCULAR | Status: DC | PRN
  Administered 2016-03-21: 1.8 mL via INTRATHECAL

## 2016-03-21 MED ORDER — VANCOMYCIN HCL IN DEXTROSE 1-5 GM/200ML-% IV SOLN
1000.0000 mg | Freq: Once | INTRAVENOUS | Status: AC
  Administered 2016-03-21: 1000 mg via INTRAVENOUS

## 2016-03-21 MED ORDER — NEOMYCIN-POLYMYXIN B GU 40-200000 IR SOLN
Status: AC
  Filled 2016-03-21: qty 20

## 2016-03-21 MED ORDER — CLINDAMYCIN PHOSPHATE 900 MG/50ML IV SOLN
900.0000 mg | INTRAVENOUS | Status: AC
  Administered 2016-03-21: 900 mg via INTRAVENOUS

## 2016-03-21 MED ORDER — MIDAZOLAM HCL 5 MG/5ML IJ SOLN
INTRAMUSCULAR | Status: DC | PRN
  Administered 2016-03-21 (×2): 1 mg via INTRAVENOUS

## 2016-03-21 MED ORDER — ENOXAPARIN SODIUM 30 MG/0.3ML ~~LOC~~ SOLN
30.0000 mg | Freq: Two times a day (BID) | SUBCUTANEOUS | Status: DC
  Administered 2016-03-22 – 2016-03-23 (×3): 30 mg via SUBCUTANEOUS
  Filled 2016-03-21 (×3): qty 0.3

## 2016-03-21 MED ORDER — MUPIROCIN 2 % EX OINT
1.0000 "application " | TOPICAL_OINTMENT | Freq: Two times a day (BID) | CUTANEOUS | Status: DC
  Administered 2016-03-22: 1 via NASAL
  Filled 2016-03-21: qty 22

## 2016-03-21 MED ORDER — SODIUM CHLORIDE 0.9 % IV SOLN
INTRAVENOUS | Status: DC
  Administered 2016-03-21 – 2016-03-22 (×2): via INTRAVENOUS

## 2016-03-21 MED ORDER — ONDANSETRON HCL 4 MG/2ML IJ SOLN
INTRAMUSCULAR | Status: DC | PRN
  Administered 2016-03-21: 4 mg via INTRAVENOUS

## 2016-03-21 MED ORDER — EPHEDRINE SULFATE 50 MG/ML IJ SOLN
INTRAMUSCULAR | Status: DC | PRN
  Administered 2016-03-21: 20 mg via INTRAVENOUS
  Administered 2016-03-21 (×2): 10 mg via INTRAVENOUS
  Administered 2016-03-21: 20 mg via INTRAVENOUS

## 2016-03-21 MED ORDER — ONDANSETRON HCL 4 MG PO TABS: 4.0000 mg | ORAL_TABLET | Freq: Four times a day (QID) | ORAL | Status: DC | PRN

## 2016-03-21 MED ORDER — MAGNESIUM HYDROXIDE 400 MG/5ML PO SUSP
30.0000 mL | Freq: Every day | ORAL | Status: DC | PRN
  Administered 2016-03-23: 30 mL via ORAL
  Filled 2016-03-21: qty 30

## 2016-03-21 MED ORDER — FENTANYL CITRATE (PF) 100 MCG/2ML IJ SOLN
INTRAMUSCULAR | Status: AC
  Administered 2016-03-21: 25 ug via INTRAVENOUS
  Filled 2016-03-21: qty 2

## 2016-03-21 MED ORDER — CLINDAMYCIN PHOSPHATE 900 MG/50ML IV SOLN
INTRAVENOUS | Status: AC
  Filled 2016-03-21: qty 50

## 2016-03-21 MED ORDER — CHLORHEXIDINE GLUCONATE 4 % EX LIQD: 60.0000 mL | Freq: Once | CUTANEOUS | Status: DC

## 2016-03-21 MED ORDER — ONDANSETRON HCL 4 MG/2ML IJ SOLN: 4.0000 mg | Freq: Once | INTRAMUSCULAR | Status: DC | PRN

## 2016-03-21 MED ORDER — PANTOPRAZOLE SODIUM 40 MG PO TBEC
40.0000 mg | DELAYED_RELEASE_TABLET | Freq: Two times a day (BID) | ORAL | Status: DC
  Administered 2016-03-21 – 2016-03-23 (×4): 40 mg via ORAL
  Filled 2016-03-21 (×4): qty 1

## 2016-03-21 MED ORDER — FERROUS SULFATE 325 (65 FE) MG PO TABS
325.0000 mg | ORAL_TABLET | Freq: Two times a day (BID) | ORAL | Status: DC
  Administered 2016-03-22 – 2016-03-23 (×2): 325 mg via ORAL
  Filled 2016-03-21 (×2): qty 1

## 2016-03-21 MED ORDER — ACETAMINOPHEN 650 MG RE SUPP: 650.0000 mg | Freq: Four times a day (QID) | RECTAL | Status: DC | PRN

## 2016-03-21 MED ORDER — CHLORHEXIDINE GLUCONATE CLOTH 2 % EX PADS
6.0000 | MEDICATED_PAD | Freq: Every day | CUTANEOUS | Status: DC
  Administered 2016-03-22: 6 via TOPICAL

## 2016-03-21 MED ORDER — SENNOSIDES-DOCUSATE SODIUM 8.6-50 MG PO TABS
1.0000 | ORAL_TABLET | Freq: Two times a day (BID) | ORAL | Status: DC
  Administered 2016-03-21 – 2016-03-23 (×4): 1 via ORAL
  Filled 2016-03-21 (×4): qty 1

## 2016-03-21 MED ORDER — CLINDAMYCIN PHOSPHATE 600 MG/50ML IV SOLN
600.0000 mg | Freq: Four times a day (QID) | INTRAVENOUS | Status: AC
  Administered 2016-03-21 – 2016-03-22 (×4): 600 mg via INTRAVENOUS
  Filled 2016-03-21 (×4): qty 50

## 2016-03-21 MED ORDER — PHENOL 1.4 % MT LIQD: 1.0000 | OROMUCOSAL | Status: DC | PRN

## 2016-03-21 MED ORDER — CYCLOSPORINE 0.05 % OP EMUL
1.0000 [drp] | Freq: Two times a day (BID) | OPHTHALMIC | Status: DC
  Filled 2016-03-21 (×5): qty 1

## 2016-03-21 MED ORDER — MORPHINE SULFATE (PF) 2 MG/ML IV SOLN
2.0000 mg | INTRAVENOUS | Status: DC | PRN
  Administered 2016-03-21: 2 mg via INTRAVENOUS
  Filled 2016-03-21: qty 1

## 2016-03-21 MED ORDER — TETRACAINE HCL 1 % IJ SOLN
INTRAMUSCULAR | Status: DC | PRN
  Administered 2016-03-21: 8 mg via INTRASPINAL

## 2016-03-21 MED ORDER — ACETAMINOPHEN 10 MG/ML IV SOLN
INTRAVENOUS | Status: DC | PRN
  Administered 2016-03-21: 1000 mg via INTRAVENOUS

## 2016-03-21 MED ORDER — CELECOXIB 200 MG PO CAPS
200.0000 mg | ORAL_CAPSULE | Freq: Two times a day (BID) | ORAL | Status: DC
  Administered 2016-03-21 – 2016-03-23 (×4): 200 mg via ORAL
  Filled 2016-03-21 (×4): qty 1

## 2016-03-21 MED ORDER — POLYVINYL ALCOHOL 1.4 % OP SOLN
1.0000 [drp] | Freq: Two times a day (BID) | OPHTHALMIC | Status: DC
  Filled 2016-03-21: qty 15

## 2016-03-21 MED ORDER — PROPRANOLOL HCL 20 MG PO TABS
20.0000 mg | ORAL_TABLET | Freq: Four times a day (QID) | ORAL | Status: DC
  Administered 2016-03-21 – 2016-03-23 (×4): 20 mg via ORAL
  Filled 2016-03-21 (×5): qty 1

## 2016-03-21 SURGICAL SUPPLY — 50 items
BLADE DRUM FLTD (BLADE) ×3 IMPLANT
BLADE SAW 1 (BLADE) ×3 IMPLANT
CANISTER SUCT 1200ML W/VALVE (MISCELLANEOUS) ×3 IMPLANT
CANISTER SUCT 3000ML (MISCELLANEOUS) ×6 IMPLANT
CAPT HIP TOTAL 2 ×3 IMPLANT
CARTRIDGE OIL MAESTRO DRILL (MISCELLANEOUS) ×1 IMPLANT
CATH FOL LEG HOLDER (MISCELLANEOUS) ×3 IMPLANT
CATH TRAY METER 16FR LF (MISCELLANEOUS) ×3 IMPLANT
DIFFUSER MAESTRO (MISCELLANEOUS) ×3 IMPLANT
DRAPE INCISE IOBAN 66X60 STRL (DRAPES) ×3 IMPLANT
DRAPE SHEET LG 3/4 BI-LAMINATE (DRAPES) ×3 IMPLANT
DRSG DERMACEA 8X12 NADH (GAUZE/BANDAGES/DRESSINGS) ×3 IMPLANT
DRSG OPSITE POSTOP 3X4 (GAUZE/BANDAGES/DRESSINGS) ×3 IMPLANT
DRSG OPSITE POSTOP 4X12 (GAUZE/BANDAGES/DRESSINGS) IMPLANT
DRSG OPSITE POSTOP 4X14 (GAUZE/BANDAGES/DRESSINGS) ×3 IMPLANT
DRSG TEGADERM 4X4.75 (GAUZE/BANDAGES/DRESSINGS) ×3 IMPLANT
DURAPREP 26ML APPLICATOR (WOUND CARE) ×6 IMPLANT
ELECT BLADE 6.5 EXT (BLADE) ×3 IMPLANT
ELECT CAUTERY BLADE 6.4 (BLADE) ×3 IMPLANT
GLOVE BIOGEL M STRL SZ7.5 (GLOVE) ×3 IMPLANT
GLOVE INDICATOR 8.0 STRL GRN (GLOVE) ×3 IMPLANT
GLOVE SURG 9.0 ORTHO LTXF (GLOVE) ×6 IMPLANT
GLOVE SURG ORTHO 9.0 STRL STRW (GLOVE) ×3 IMPLANT
GOWN STRL REUS W/ TWL LRG LVL3 (GOWN DISPOSABLE) ×2 IMPLANT
GOWN STRL REUS W/TWL 2XL LVL3 (GOWN DISPOSABLE) ×3 IMPLANT
GOWN STRL REUS W/TWL LRG LVL3 (GOWN DISPOSABLE) ×4
HANDPIECE SUCTION TUBG SURGILV (MISCELLANEOUS) ×3 IMPLANT
HEMOVAC 400CC 10FR (MISCELLANEOUS) ×3 IMPLANT
HOOD PEEL AWAY FLYTE STAYCOOL (MISCELLANEOUS) ×6 IMPLANT
KIT RM TURNOVER STRD PROC AR (KITS) ×3 IMPLANT
NDL SAFETY 18GX1.5 (NEEDLE) ×3 IMPLANT
NS IRRIG 500ML POUR BTL (IV SOLUTION) ×3 IMPLANT
OIL CARTRIDGE MAESTRO DRILL (MISCELLANEOUS) ×3
PACK HIP PROSTHESIS (MISCELLANEOUS) ×3 IMPLANT
PIN STEIN THRED 5/32 (Pin) ×3 IMPLANT
SOL .9 NS 3000ML IRR  AL (IV SOLUTION) ×2
SOL .9 NS 3000ML IRR UROMATIC (IV SOLUTION) ×1 IMPLANT
SOL PREP PVP 2OZ (MISCELLANEOUS) ×3
SOLUTION PREP PVP 2OZ (MISCELLANEOUS) ×1 IMPLANT
SPONGE DRAIN TRACH 4X4 STRL 2S (GAUZE/BANDAGES/DRESSINGS) ×3 IMPLANT
STAPLER SKIN PROX 35W (STAPLE) ×3 IMPLANT
SUT ETHIBOND #5 BRAIDED 30INL (SUTURE) ×3 IMPLANT
SUT VIC AB 0 CT1 36 (SUTURE) ×3 IMPLANT
SUT VIC AB 1 CT1 36 (SUTURE) ×6 IMPLANT
SUT VIC AB 2-0 CT1 27 (SUTURE) ×2
SUT VIC AB 2-0 CT1 TAPERPNT 27 (SUTURE) ×1 IMPLANT
SYR 20CC LL (SYRINGE) ×3 IMPLANT
TAPE ADH 3 LX (MISCELLANEOUS) ×3 IMPLANT
TAPE TRANSPORE STRL 2 31045 (GAUZE/BANDAGES/DRESSINGS) ×3 IMPLANT
TOWEL OR 17X26 4PK STRL BLUE (TOWEL DISPOSABLE) ×3 IMPLANT

## 2016-03-21 NOTE — Progress Notes (Signed)
Patient temp is low, warming blanket placed on patient.

## 2016-03-21 NOTE — Brief Op Note (Signed)
03/21/2016  2:59 PM  PATIENT:  Derek Haney  82 y.o. male  PRE-OPERATIVE DIAGNOSIS:  OSTEOARTHRITIS of the right hip  POST-OPERATIVE DIAGNOSIS:  Same  PROCEDURE:  Procedure(s): TOTAL HIP ARTHROPLASTY (Right)  SURGEON:  Surgeon(s) and Role:    * [name], MD - Primary  ASSISTANTS: Vance Peper, PA   ANESTHESIA:   spinal  EBL:  Total I/O In: 1300 [I.V.:1300] Out: 250 [Urine:100; Blood:150]  BLOOD ADMINISTERED:none  DRAINS: 2 medium Hemovac drains   LOCAL MEDICATIONS USED:  NONE  SPECIMEN:  Source of Specimen:  Right femoral head  DISPOSITION OF SPECIMEN:  PATHOLOGY  COUNTS:  YES  TOURNIQUET:  * No tourniquets in log *  DICTATION: .Dragon Dictation  PLAN OF CARE: Admit to inpatient   PATIENT DISPOSITION:  PACU - hemodynamically stable.   Delay start of Pharmacological VTE agent (>24hrs) due to surgical blood loss or risk of bleeding: yes

## 2016-03-21 NOTE — Progress Notes (Signed)
Upon admission, PACU nurse states not much output in foley. [facility] radiology called stating that tip of foley is near prostate in urethra. Foley catheter repositioned and urine is flowing through foley catheter with good output.

## 2016-03-21 NOTE — H&P (Signed)
The patient has been re-examined, and the chart reviewed, and there have been no interval changes to the documented history and physical.    The risks, benefits, and alternatives have been discussed at length. The patient expressed understanding of the risks benefits and agreed with plans for surgical intervention.  Jerryl Holzhauer P. Leshaun Biebel, Jr. M.D.    

## 2016-03-21 NOTE — NC FL2 (Signed)
[place] LEVEL OF CARE SCREENING TOOL     IDENTIFICATION  Patient Name: Derek Haney Birthdate: December 01, 1932 Sex: male Admission Date (Current Location): 03/21/2016  [place] and [place] Number:  Engineering geologist and Address:  [facility], [address], [place], [place] [address]      Provider Number: [id number]  Attending Physician Name and Address:  [name], MD  Relative Name and Phone Number:       Current Level of Care: Hospital Recommended Level of Care: [place] Prior Approval Number:    Date Approved/Denied:   PASRR Number:  ([id number])  Discharge Plan: SNF    Current Diagnoses: Patient Active Problem List   Diagnosis Date Noted  . 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  Hypertension    Congestive heart failure (CMS-HCC)    Low back pain    COPD (chronic obstructive pulmonary disease) , unspecified (CMS-HCC)    Sinusitis, unspecified    Glaucoma       Orientation RESPIRATION BLADDER Height & Weight     Self, Time, Situation, Place  Normal Continent Weight:   Height:     BEHAVIORAL SYMPTOMS/MOOD NEUROLOGICAL BOWEL NUTRITION STATUS   (none )  (none ) Continent Diet (Regular Diet )  AMBULATORY STATUS COMMUNICATION OF NEEDS Skin   Extensive Assist Verbally Surgical wounds (Incision: Right Hip. )                       Personal Care Assistance Level of Assistance  Bathing, Feeding, Dressing Bathing Assistance: Limited assistance Feeding assistance: Independent Dressing Assistance: Limited assistance     Functional Limitations Info  Sight, Hearing, Speech Sight Info: Adequate Hearing Info: Adequate Speech Info: Adequate    SPECIAL CARE FACTORS  FREQUENCY  PT (By licensed PT), OT (By licensed OT)     PT Frequency:  (5) OT Frequency:  (5)            Contractures      Additional Factors Info  Code Status, Allergies, Isolation Precautions Code Status Info:  (Not on File ) Allergies Info:  (Penicillins )     Isolation Precautions Info:  (MRSA Nasal Swab )     Current Medications (03/21/2016):  This is the current hospital active medication list Current Facility-Administered Medications  Medication Dose Route Frequency Provider Last Rate Last Dose  . 0.9 %  sodium chloride infusion   Intravenous Continuous [name], MD      . acetaminophen (OFIRMEV) IV 1,000 mg  1,000 mg Intravenous Q6H [name], MD      . acetaminophen (TYLENOL) tablet 650 mg  650 mg Oral Q6H PRN [name], MD       Or  . acetaminophen (TYLENOL) suppository 650 mg  650 mg Rectal Q6H PRN [name], MD      . alum & mag hydroxide-simeth (MAALOX/MYLANTA) 200-200-20 MG/5ML suspension 30 mL  30 mL Oral Q4H PRN [name], MD      . atorvastatin (LIPITOR) tablet 20 mg  20 mg Oral q1800 [name], MD      . bisacodyl (DULCOLAX) suppository 10 mg  10 mg Rectal Daily PRN [name], MD      .  celecoxib (CELEBREX) capsule 200 mg  200 mg Oral Q12H [name], MD      . clindamycin (CLEOCIN) 900 MG/50ML IVPB           . clindamycin (CLEOCIN) IVPB 600 mg  600 mg Intravenous Q6H [name], MD      . cycloSPORINE (RESTASIS) 0.05 % ophthalmic emulsion 1 drop  1 drop Both Eyes BID [name], MD      . diphenhydrAMINE (BENADRYL) 12.5 MG/5ML elixir 12.5-25 mg  12.5-25 mg Oral Q4H PRN [name], MD      . dorzolamide (TRUSOPT) 2 % ophthalmic solution 1 drop  1 drop Both Eyes BID [name], MD      . enoxaparin (LOVENOX) injection 30 mg  30 mg Subcutaneous Q12H [name], MD      . ferrous sulfate tablet 325 mg  325 mg Oral BID WC [name], MD      . furosemide (LASIX) tablet 20 mg  20 mg Oral Daily [name], MD      . isosorbide mononitrate (IMDUR) 24 hr tablet 30 mg  30 mg Oral TID [name], MD      . latanoprost (XALATAN) 0.005 % ophthalmic solution 1 drop  1 drop Both Eyes QHS [name], MD      . magnesium hydroxide (MILK OF MAGNESIA) suspension 30 mL  30 mL Oral Daily PRN [name], MD      . menthol-cetylpyridinium (CEPACOL) lozenge 3 mg  1 lozenge Oral PRN [name], MD       Or  . phenol (CHLORASEPTIC) mouth spray 1 spray  1 spray Mouth/Throat PRN [name], MD      . metoCLOPramide (REGLAN) tablet 10 mg  10 mg Oral TID AC & HS [name], MD      . morphine 2 MG/ML injection 2 mg  2 mg Intravenous Q2H PRN [name], MD      . ondansetron (ZOFRAN) tablet 4 mg  4 mg Oral Q6H PRN [name], MD       Or  . ondansetron (ZOFRAN) injection 4 mg  4 mg Intravenous Q6H PRN [name], MD      . oxyCODONE (Oxy IR/ROXICODONE) immediate release tablet 5-10 mg  5-10 mg Oral Q4H PRN [name], MD      . pantoprazole (PROTONIX) EC tablet 40 mg  40 mg Oral BID [name], MD      . polyvinyl alcohol (LIQUIFILM TEARS) 1.4 % ophthalmic solution 1 drop  1 drop Both Eyes BID [name], MD      . potassium chloride SA (K-DUR,KLOR-CON) CR tablet 20 mEq  20 mEq Oral Daily [name], MD      . propranolol (INDERAL) tablet 20 mg  20 mg Oral QID [name], MD      . senna-docusate (Senokot-S) tablet 1 tablet  1 tablet Oral BID [name], MD      . sodium chloride flush 0.9 % injection           . sodium phosphate (FLEET) 7-19 GM/118ML enema 1 enema  1 enema Rectal Once PRN [name], MD      . traMADol Veatrice Bourbon) tablet 50-100 mg  50-100 mg Oral Q4H PRN [name], MD      . traZODone (DESYREL) tablet 100 mg  100 mg Oral QHS  [name], MD         Discharge Medications: Please see discharge summary for a list of discharge medications.  Relevant Imaging Results:  Relevant Lab Results:   Additional Information   (SSN: [id number])  Loralyn Freshwater, LCSW

## 2016-03-21 NOTE — Op Note (Signed)
OPERATIVE NOTE  DATE OF SURGERY:  03/21/2016  PATIENT NAME:  Derek Haney   DOB: 1933/05/12  MRN: [id number]  PRE-OPERATIVE DIAGNOSIS: Degenerative arthrosis of the right hip, primary  POST-OPERATIVE DIAGNOSIS:  Same  PROCEDURE:  Right total hip arthroplasty  SURGEON:  Marciano Sequin. M.D.  ASSISTANT:  Vance Peper, PA (present and scrubbed throughout the case, critical for assistance with exposure, retraction, instrumentation, and closure)  ANESTHESIA: spinal  ESTIMATED BLOOD LOSS: 150 mL  FLUIDS REPLACED: 1300 mL of crystalloid  DRAINS: 2 medium drains to a Hemovac reservoir  IMPLANTS UTILIZED: DePuy 16.5 mm large stature AML femoral stem, 60 mm OD Pinnacle Gription Sector acetabular component, 10 +4 mm neutral Pinnacle Marathon polyethylene insert, and a 36 mm M-SPEC +1.5 mm hip ball, 6.5 mm x 30 mm cancellous bone screw  INDICATIONS FOR SURGERY: Derek Haney is a 82 y.o. year old male with a long history of progressive hip and groin  pain. X-rays demonstrated severe degenerative changes. The patient had not seen any significant improvement despite conservative nonsurgical intervention. After discussion of the risks and benefits of surgical intervention, the patient expressed understanding of the risks benefits and agree with plans for total hip arthroplasty.   The risks, benefits, and alternatives were discussed at length including but not limited to the risks of infection, bleeding, nerve injury, stiffness, blood clots, the need for revision surgery, limb length inequality, dislocation, cardiopulmonary complications, among others, and they were willing to proceed.  PROCEDURE IN DETAIL: The patient was brought into the operating room and, after adequate spinal anesthesia was achieved, the patient was placed in a left lateral decubitus position. Axillary roll was placed and all bony prominences were well-padded. The patient's right hip was cleaned and prepped with alcohol and  DuraPrep and draped in the usual sterile fashion. A "timeout" was performed as per usual protocol. A lateral curvilinear incision was made gently curving towards the posterior superior iliac spine. The IT band was incised in line with the skin incision and the fibers of the gluteus maximus were split in line. The piriformis tendon was identified, skeletonized, and incised at its insertion to the proximal femur and reflected posteriorly. A T type posterior capsulotomy was performed. Prior to dislocation of the femoral head, a threaded Steinmann pin was inserted through a separate stab incision into the pelvis superior to the acetabulum and bent in the form of a stylus so as to assess limb length and hip offset throughout the procedure. The femoral head was then dislocated posteriorly. Inspection of the femoral head demonstrated severe degenerative changes with full-thickness loss of articular cartilage. The femoral neck cut was performed using an oscillating saw. The anterior capsule was elevated off of the femoral neck using a periosteal elevator. Attention was then directed to the acetabulum. The remnant of the labrum was excised using electrocautery. Inspection of the acetabulum also demonstrated significant degenerative changes. The acetabulum was reamed in sequential fashion up to a 59 mm diameter. Good punctate bleeding bone was encountered. A 60 mm Pinnacle Gription sector acetabular component was positioned and impacted into place. Good scratch fit was appreciated. A 6 mm x 30 mm cancellous bone screw was then inserted through a dome hole for additional fixation. A neutral polyethylene trial was inserted.  Attention was then directed to the proximal femur. A hole for reaming of the proximal femoral canal was created using a high-speed burr. The femoral canal was reamed in sequential fashion up to a 16.5 mm diameter. This allowed  for approximately 5 cm of scratch fit. Serial broaches were inserted up to a  16.5 mm large stature femoral broach. Calcar region was planed and a trial reduction was performed using a 36 mm hip ball with a +1.5 mm neck length. Good stability was noted, but it was elected to trial with a +4 mm 10 polyethylene trial. Good equalization of limb lengths and hip offset was appreciated and excellent stability was noted both anteriorly and posteriorly. Trial components were removed. The acetabular shell was irrigated with copious amounts of normal saline with antibiotic solution and suctioned dry. A 10 +4 mm Pinnacle Marathon polyethylene insert was positioned and impacted into place. Next, a 16.5 mm large stature AML femoral stem was positioned and impacted into place. Excellent scratch fit was appreciated. A trial reduction was again performed with a 36 mm hip ball with a +1.5 mm neck length. Again, good equalization of limb lengths was appreciated and excellent stability appreciated both anteriorly and posteriorly. The hip was then dislocated and the trial hip ball was removed. The Morse taper was cleaned and dried. A 36 mm M-SPEC hip ball with a +1.5 mm neck length was placed on the trunnion and impacted into place. The hip was then reduced and placed through range of motion. Excellent stability was appreciated both anteriorly and posteriorly.  The wound was irrigated with copious amounts of normal saline with antibiotic solution and suctioned dry. Good hemostasis was appreciated. The posterior capsulotomy was repaired using #5 Ethibond. Piriformis tendon was reapproximated to the undersurface of the gluteus medius tendon using #5 Ethibond. Two medium drains were placed in the wound bed and brought out through separate stab incisions to be attached to a Hemovac reservoir. The IT band was reapproximated using interrupted sutures of #1 Vicryl. Subcutaneous tissue was approximated using first #0 Vicryl followed by #2-0 Vicryl. The skin was closed with skin staples.  The patient tolerated the  procedure well and was transported to the recovery room in stable condition.   Marciano Sequin., M.D.

## 2016-03-21 NOTE — Transfer of Care (Signed)
Immediate Anesthesia Transfer of Care Note  Patient: Derek Haney  Procedure(s) Performed: Procedure(s): TOTAL HIP ARTHROPLASTY (Right)  Patient Location: PACU  Anesthesia Type:Spinal  Level of Consciousness: awake  Airway & Oxygen Therapy: Patient Spontanous Breathing and Patient connected to face mask oxygen  Post-op Assessment: Report given to RN and Post -op Vital signs reviewed and stable  Post vital signs: Reviewed and stable  Last Vitals:  Filed Vitals:   03/21/16 1005  BP: 148/68  Pulse: 54  Temp: 36.2 C  Resp: 16    Last Pain:  Filed Vitals:   03/21/16 1006  PainSc: 0-No pain         Complications: No apparent anesthesia complications

## 2016-03-21 NOTE — Anesthesia Preprocedure Evaluation (Signed)
Anesthesia Evaluation  Patient identified by MRN, date of birth, ID band Patient awake    Reviewed: Allergy & Precautions, NPO status , Patient's Chart, lab work & pertinent test results  Airway Mallampati: II       Dental  (+) Partial Upper   Pulmonary neg pulmonary ROS, former smoker,    breath sounds clear to auscultation       Cardiovascular Exercise Tolerance: Good hypertension, Pt. on medications and Pt. on home beta blockers + CAD and + Past MI  + dysrhythmias  Rhythm:Regular Rate:Normal     Neuro/Psych negative neurological ROS     GI/Hepatic Neg liver ROS, GERD  ,  Endo/Other  negative endocrine ROS  Renal/GU negative Renal ROS     Musculoskeletal   Abdominal Normal abdominal exam  (+)   Peds  Hematology  (+) anemia ,   Anesthesia Other Findings   Reproductive/Obstetrics                             Anesthesia Physical Anesthesia Plan  ASA: III  Anesthesia Plan: Spinal   Post-op Pain Management:    Induction: Intravenous  Airway Management Planned: Natural Airway and Nasal Cannula  Additional Equipment:   Intra-op Plan:   Post-operative Plan:   Informed Consent: I have reviewed the patients History and Physical, chart, labs and discussed the procedure including the risks, benefits and alternatives for the proposed anesthesia with the patient or authorized representative who has indicated his/her understanding and acceptance.     Plan Discussed with: CRNA  Anesthesia Plan Comments:         Anesthesia Quick Evaluation

## 2016-03-21 NOTE — Anesthesia Procedure Notes (Addendum)
Spinal Patient location during procedure: OR Start time: 03/21/2016 11:26 AM End time: 03/21/2016 11:29 AM Reason for block: at surgeon's request Staffing Anesthesiologist: Marline Backbone F Performed by: anesthesiologist  Preanesthetic Checklist Completed: patient identified, site marked, surgical consent, pre-op evaluation, timeout performed, IV checked, risks and benefits discussed, monitors and equipment checked and at surgeon's request Spinal Block Patient position: sitting Prep: Betadine Patient monitoring: heart rate and blood pressure Approach: midline Location: L3-4 Injection technique: single-shot Needle Needle type: Quincke  Needle gauge: 22 G Needle length: 9 cm Needle insertion depth: 6 cm Assessment Sensory level: T6  Date/Time: 03/21/2016 11:30 AM Performed by: [id number], Laurence Folz Oxygen Delivery Method: Simple face mask

## 2016-03-22 LAB — BASIC METABOLIC PANEL
ANION GAP: 8 (ref 5–15)
BUN: 16 mg/dL (ref 6–20)
CHLORIDE: 100 mmol/L — AB (ref 101–111)
CO2: 25 mmol/L (ref 22–32)
Calcium: 8.3 mg/dL — ABNORMAL LOW (ref 8.9–10.3)
Creatinine, Ser: 1.06 mg/dL (ref 0.61–1.24)
GFR calc non Af Amer: >60 mL/min (ref >60)
Glucose, Bld: 110 mg/dL — ABNORMAL HIGH (ref 65–99)
Potassium: 4.1 mmol/L (ref 3.5–5.1)
Sodium: 133 mmol/L — ABNORMAL LOW (ref 135–145)

## 2016-03-22 LAB — CBC
HEMATOCRIT: 26.6 % — AB (ref 40.0–52.0)
HEMOGLOBIN: 9.3 g/dL — AB (ref 13.0–18.0)
MCH: 30.7 pg (ref 26.0–34.0)
MCHC: 34.9 g/dL (ref 32.0–36.0)
MCV: 88.1 fL (ref 80.0–100.0)
Platelets: 195 10*3/uL (ref 150–440)
RBC: 3.02 MIL/uL — ABNORMAL LOW (ref 4.40–5.90)
RDW: 13.6 % (ref 11.5–14.5)
WBC: 10.3 10*3/uL (ref 3.8–10.6)

## 2016-03-22 MED ORDER — TRAMADOL HCL 50 MG PO TABS: 50.0000 mg | ORAL_TABLET | ORAL | Status: DC | PRN

## 2016-03-22 MED ORDER — LACTULOSE 10 GM/15ML PO SOLN
10.0000 g | Freq: Two times a day (BID) | ORAL | Status: DC | PRN
  Administered 2016-03-23: 10 g via ORAL
  Filled 2016-03-22: qty 30

## 2016-03-22 MED ORDER — ENOXAPARIN SODIUM 40 MG/0.4ML ~~LOC~~ SOLN: 40.0000 mg | SUBCUTANEOUS | Status: DC

## 2016-03-22 MED ORDER — OXYCODONE HCL 5 MG PO TABS: 5.0000 mg | ORAL_TABLET | ORAL | Status: DC | PRN

## 2016-03-22 NOTE — Clinical Social Work Note (Signed)
Clinical Social Work Assessment  Patient Details  Name: Derek Haney MRN: [id number] Date of Birth: Jun 10, 1933  Date of referral:  03/22/16               Reason for consult:  Facility Placement                Permission sought to share information with:  Chartered certified accountant granted to share information::  Yes, Verbal Permission Granted  Name::      Derek Haney::   Derek Haney   Relationship::     Contact Information:     Housing/Transportation Living arrangements for the past 2 months:  [place] of Information:  Patient, Spouse Patient Interpreter Needed:  None Criminal Activity/Legal Involvement Pertinent to Current Situation/Hospitalization:  No - Comment as needed Significant Relationships:  Adult Children, Spouse Lives with:  Spouse Do you feel safe going back to the place where you live?  Yes Need for family participation in patient care:  Yes (Comment)  Care giving concerns:  Patient lives in [place], [place] with his wife Derek Haney.     Social Worker assessment / plan:  Holiday representative (CSW) received SNF consult. PT is recommending SNF. CSW met with patient and his wife Derek Haney was at bedside. Patient was alert and oriented and sitting up in the chair. CSW introduced self and explained role of CSW department. Patient reported that he lives in [place] with his wife Derek Haney. CSW explained that PT is recommending SNF. Patient and wife are agreeable to SNF search and prefer [facility]   CSW presented bed offers. Patient and wife chose [facility]. Patient will likely D/C tomorrow pending medical clearance. CSW will continue to follow and assist as needed.   Employment status:  Disabled (Comment on whether or not currently receiving Disability), Retired Nurse, adult PT Recommendations:  [place] / Referral to community resources:  [place]  Patient/Family's Response to care:  Patient and wife are agreeable for patient to D/C to [facility]   Patient/Family's Understanding of and Emotional Response to Diagnosis, Current Treatment, and Prognosis:  Patient and wife were pleasant and thanked CSW for visit.   Emotional Assessment Appearance:  Appears stated age Attitude/Demeanor/Rapport:    Affect (typically observed):  Accepting, Adaptable, Pleasant Orientation:  Oriented to Self, Oriented to Place, Oriented to  Time, Oriented to Situation Alcohol / Substance use:  Not Applicable Psych involvement (Current and /or in the community):  No (Comment)  Discharge Needs  Concerns to be addressed:  Discharge Planning Concerns Readmission within the last 30 days:  No Current discharge risk:  Dependent with Mobility Barriers to Discharge:  Continued Medical Work up   Derek Freshwater, LCSW 03/22/2016, 3:05 PM

## 2016-03-22 NOTE — Progress Notes (Signed)
Pt and family would prefer to use [facility]. Will notify case mgr.

## 2016-03-22 NOTE — Progress Notes (Signed)
Physical Therapy Treatment Patient Details Name: Derek Haney MRN: [id number] DOB: [birth year] Today's Date: 03/22/2016    History of Present Illness Patient is an 82 yo male who was admitted to [facility] for elective right total hip replacement, posterior approach on 03/21/16 secondary to degenerative arthrosis.      PT Comments    Pt is progressing towards goals. Pt able to perform bed mobility from sit to supine with mod assist for B LE. Pt able to transfer using RW and max assist. Pt able to ambulate approx 40 ft using RW and mod assist, requiring heavy cueing for walker placement and sequencing. Pt performed toileting with max assist for sitting on/standing from [facility]. Pt demonstrates deficits in strength, ROM, and mobility. Pt would benefit from further skilled PT to address deficits and return to PLOF; recommend pt sent to SNF after discharge from acute hospitalization.   Follow Up Recommendations  SNF     Equipment Recommendations       Recommendations for Other Services       Precautions / Restrictions Precautions Precautions: Posterior Hip;Fall Precaution Booklet Issued: No Restrictions Weight Bearing Restrictions: Yes RLE Weight Bearing: Weight bearing as tolerated Other Position/Activity Restrictions: No adduction past neutral, no flexion past 70 degrees, no IR    Mobility  Bed Mobility Overal bed mobility: Needs Assistance Bed Mobility: Sit to Supine     Supine to sit: +2 for physical assistance;Mod assist Sit to supine: Mod assist   General bed mobility comments: Pt able to perform bed mobility with mod assist. Pt required assist with moving B LE and to move to head of bed. Pt provided cues regarding hand placement during mobility.   Transfers Overall transfer level: Needs assistance Equipment used: Rolling walker (2 wheeled) Transfers: Sit to/from Stand Sit to Stand: Max assist         General transfer comment: Pt able to transfer from EOB using RW and max  assist. Pt required increased time to stand up and upright posture. Pt provided heavy cues regarding hand and R LE placement prior to standing. Once standing, pt required increased time to maintain WB on R LE.   Ambulation/Gait Ambulation/Gait assistance: Mod assist Ambulation Distance (Feet): 40 Feet Assistive device: Rolling walker (2 wheeled) Gait Pattern/deviations: Step-to pattern Gait velocity: decreased Gait velocity interpretation: <1.8 ft/sec, indicative of risk for recurrent falls General Gait Details: Pt able to ambulate approx 40 ft using RW and mod assist. Pt demonstrated slow, step-to gait pattern. However, pt demonstrated improvement in gait speed and fluidity of movement compared to previous visit. Pt provided heavy cues continuously throughout ambulation regarding walker placement and proper sequencing. Pt took 2 rest breaks in between ambulation to perform toileting.    Stairs            Wheelchair Mobility    Modified Rankin (Stroke Patients Only)       Balance Overall balance assessment: Needs assistance Sitting-balance support: Bilateral upper extremity supported Sitting balance-Leahy Scale: Good Sitting balance - Comments: Pt able to maintain sitting balance on EOB with B UE supported for approx 2 mins.    Standing balance support: Bilateral upper extremity supported Standing balance-Leahy Scale: Fair Standing balance comment: Pt able to stand with use of RW and min assist for safety. Pt required increased time to weight shift on R LE and maintain standing balance.                     Cognition Arousal/Alertness: Awake/alert Behavior During  Therapy: WFL for tasks assessed/performed Overall Cognitive Status: Within Functional Limits for tasks assessed       Memory: Decreased recall of precautions              Exercises Other Exercises Other Exercises: Pt performed toileting on BSC in bathroom. Pt required assist sitting on and standing  up from [facility]. Pt provided heavy cues for hand and R LE placement when sittnig/standing. Pt reminded of precautions when sitting on BSC.     General Comments        Pertinent Vitals/Pain Pain Assessment: 0-10 Pain Score: 1  Pain Location: R hip Pain Descriptors / Indicators: Operative site guarding Pain Intervention(s): Limited activity within patient's tolerance    Home Living Family/patient expects to be discharged to:: Private residence Living Arrangements: Spouse/significant other Available Help at Discharge: Family Type of Home: House Home Access: Stairs to enter Entrance Stairs-Rails: Right;Left Home Layout: Two level;Able to live on main level with bedroom/bathroom Home Equipment: Gilford Rile - 2 wheels;Shower seat - built in;Grab bars - tub/shower;Other (comment) (handicap commode) Additional Comments: Pt lives at home with wife who is available to assist t/o the day.     Prior Function Level of Independence: Independent      Comments: Pt performed all ADLs indepdently. Pt occasionally used SPC for ambulation d/t hip pain.    PT Goals (current goals can now be found in the care plan section) Acute Rehab PT Goals Patient Stated Goal: to be independent with ADLs PT Goal Formulation: With patient Time For Goal Achievement: 04/05/16 Potential to Achieve Goals: Good Progress towards PT goals: Progressing toward goals    Frequency  BID    PT Plan Current plan remains appropriate    Co-evaluation             End of Session Equipment Utilized During Treatment: Gait belt Activity Tolerance: Patient tolerated treatment well Patient left: in bed;with call bell/phone within reach;with bed alarm set;with family/visitor present;with SCD's reapplied     Time: [id number] PT Time Calculation (min) (ACUTE ONLY): 30 min  Charges:  $Therapeutic Exercise: 8-22 mins                    G Codes:      Sherral Hammers 28-Mar-2016, 3:25 PM M. Barnett Abu, SPT

## 2016-03-22 NOTE — Evaluation (Signed)
Occupational Therapy Evaluation Patient Details Name: Derek Haney MRN: [id number] DOB: [birth year] Today's Date: 03/22/2016    History of Present Illness Patient is a 82 yo male who was admitted to [facility] for elective right total hip replacement, posterior approach secondary to degenerative arthrosis.     Clinical Impression   Patient seen this date for OT evaluation following right total hip replacement with posterior approach.  Patient lives at home with his wife in a 2 story home but stays on the first level.  He was previously independent with all self care tasks, IADLs including driving.  He has a walker, handicapped toilet, walk in shower with built in seat and grab bars.  He presents with muscle weakness of right LE, decreased ability to perform transfers, functional mobility and decreased ability to perform basic self care tasks especially now that he has posterior hip precautions.  He will benefit from skilled OT intervention to maximize his safety and independence in necessary daily tasks.     Follow Up Recommendations  Home health OT (depending on progress)    Equipment Recommendations       Recommendations for Other Services       Precautions / Restrictions Precautions Precautions: Posterior Hip;Fall Restrictions Weight Bearing Restrictions: Yes RLE Weight Bearing: Weight bearing as tolerated      Mobility Bed Mobility Overal bed mobility: Needs Assistance;+ 2 for safety/equipment                Transfers Overall transfer level: Needs assistance Equipment used: Rolling walker (2 wheeled) Transfers: Sit to/from Stand Sit to Stand: +2 physical assistance;Mod assist         General transfer comment: Moderate assist of 2 sit to stand from bed, once on feet, min assist and cues with walker and to manage equipment.    Balance                                            ADL Overall ADL's : Needs assistance/impaired Eating/Feeding: Set  up   Grooming: Set up   Upper Body Bathing: Set up   Lower Body Bathing: Set up;Maximal assistance   Upper Body Dressing : Set up   Lower Body Dressing: Set up;Maximal assistance Lower Body Dressing Details (indicate cue type and reason): Patient has posterior hip precautions, educated on precautions in terms of effects on self care tasks.  Will require assistance for lower body self care or use of adaptive equipment. Toilet Transfer: Set up;Maximal assistance   Toileting- Clothing Manipulation and Hygiene: Set up;Moderate assistance       Functional mobility during ADLs: Minimal assistance;Cueing for sequencing;Rolling walker;Cueing for safety General ADL Comments: Sit to stand from bed requires moderate assist of 2 this date.     Vision     Perception     Praxis      Pertinent Vitals/Pain Pain Assessment: 0-10 Pain Score: 1  Pain Descriptors / Indicators: Aching;Operative site guarding     Hand Dominance Right   Extremity/Trunk Assessment Upper Extremity Assessment Upper Extremity Assessment: Overall WFL for tasks assessed   Lower Extremity Assessment Lower Extremity Assessment: Defer to PT evaluation       Communication Communication Communication: No difficulties   Cognition Arousal/Alertness: Awake/alert Behavior During Therapy: WFL for tasks assessed/performed Overall Cognitive Status: Within Functional Limits for tasks assessed       Memory: Decreased recall of  precautions             General Comments       Exercises       Shoulder Instructions      Home Living Family/patient expects to be discharged to:: Private residence Living Arrangements: Spouse/significant other Available Help at Discharge: Family Type of Home: House Home Access: Stairs to enter [employer] of Steps: 3 Entrance Stairs-Rails: Right;Left Home Layout: Two level;Able to live on main level with bedroom/bathroom     Bathroom Shower/Tub: Walk-in  shower;Door   Bathroom Toilet: Handicapped height Bathroom Accessibility: Yes   Home Equipment: Environmental consultant - 2 wheels;Shower seat - built in;Grab bars - tub/shower          Prior Functioning/Environment Level of Independence: Independent             OT Diagnosis: Generalized weakness;Acute pain;Other (comment) (decreased ADLs/transfers/mobility)   OT Problem List: Decreased strength;Impaired balance (sitting and/or standing);Decreased knowledge of precautions;Pain;Decreased knowledge of use of DME or AE;Other (comment) (decreased self care)   OT Treatment/Interventions: Self-care/ADL training;Therapeutic exercise;Patient/family education;Balance training;Therapeutic activities;DME and/or AE instruction    OT Goals(Current goals can be found in the care plan section) Acute Rehab OT Goals Patient Stated Goal: Patient reports he wants to be independent like he was and to walk again. OT Goal Formulation: With patient Time For Goal Achievement: 04/05/16 Potential to Achieve Goals: Good  OT Frequency: Min 1X/week   Barriers to D/C:            Co-evaluation              End of Session Equipment Utilized During Treatment: Gait belt;Rolling walker  Activity Tolerance: Patient tolerated treatment well Patient left: in chair;with call bell/phone within reach;with chair alarm set;with family/visitor present   Time: 1135-1159 OT Time Calculation (min): 24 min Charges:  OT General Charges $OT Visit: 1 Procedure OT Evaluation $OT Eval Low Complexity: 1 Procedure OT Treatments $Self Care/Home Management : 8-22 mins G-Codes:    Amy T Lovett, OTR/L, CLT  Lovett,Amy 03/22/2016, 12:37 PM

## 2016-03-22 NOTE — Discharge Instructions (Signed)

## 2016-03-22 NOTE — Anesthesia Postprocedure Evaluation (Signed)
Anesthesia Post Note  Patient: Derek Haney  Procedure(s) Performed: Procedure(s) (LRB): TOTAL HIP ARTHROPLASTY (Right)  Patient location during evaluation: PACU Anesthesia Type: General Level of consciousness: awake Pain management: pain level controlled Vital Signs Assessment: post-procedure vital signs reviewed and stable Respiratory status: spontaneous breathing Cardiovascular status: blood pressure returned to baseline Anesthetic complications: no    Last Vitals:  Filed Vitals:   03/22/16 0014 03/22/16 0343  BP: 94/48 101/52  Pulse: 60 63  Temp:  36.8 C  Resp:  19    Last Pain:  Filed Vitals:   03/22/16 0609  PainSc: 4                  VAN STAVEREN,Lynsie Mcwatters

## 2016-03-22 NOTE — Clinical Social Work Placement (Signed)
   CLINICAL SOCIAL WORK PLACEMENT  NOTE  Date:  03/22/2016  Patient Details  Name: Derek Haney MRN: [id number] Date of Birth: [birth year]  Clinical Social Work is seeking post-discharge placement for this patient at the [place] level of care (*CSW will initial, date and re-position this form in  chart as items are completed):  Yes   Patient/family provided with [facility] Work Department's list of facilities offering this level of care within the geographic area requested by the patient (or if unable, by the patient's family).  Yes   Patient/family informed of their freedom to choose among providers that offer the needed level of care, that participate in Medicare, Medicaid or managed care program needed by the patient, have an available bed and are willing to accept the patient.  Yes   Patient/family informed of [facility]'s ownership interest in [facility] and [facility], as well as of the fact that they are under no obligation to receive care at these facilities.  PASRR submitted to EDS on 03/22/16     PASRR number received on 03/22/16     Existing PASRR number confirmed on       FL2 transmitted to all facilities in geographic area requested by pt/family on 03/22/16     FL2 transmitted to all facilities within larger geographic area on       Patient informed that his/her managed care company has contracts with or will negotiate with certain facilities, including the following:        Yes   Patient/family informed of bed offers received.  Patient chooses bed at  [facility] )     Physician recommends and patient chooses bed at      Patient to be transferred to   on  .  Patient to be transferred to facility by       Patient family notified on   of transfer.  Name of family member notified:        PHYSICIAN       Additional Comment:    _______________________________________________ Loralyn Freshwater, LCSW 03/22/2016,  3:04 PM

## 2016-03-22 NOTE — Progress Notes (Signed)
   Subjective: 1 Day Post-Op Procedure(s) (LRB): TOTAL HIP ARTHROPLASTY (Right) Patient reports pain as mild.   Patient is well, and has had no acute complaints or problems We will start therapy today.  Plan is to go Home after hospital stay. no nausea and no vomiting Patient denies any chest pains or shortness of breath. Objective: Vital signs in last 24 hours: Temp:  [93.3 F (34.1 C)-98.3 F (36.8 C)] 98.2 F (36.8 C) (05/04 0343) Pulse Rate:  [47-66] 63 (05/04 0343) Resp:  [13-19] 19 (05/04 0343) BP: (90-148)/(42-71) 101/52 mmHg (05/04 0343) SpO2:  [92 %-100 %] 94 % (05/04 0343) Weight:  [97.342 kg (214 lb 9.6 oz)] 97.342 kg (214 lb 9.6 oz) (05/03 1612) well approximated incision Heels are non tender and elevated off the bed using rolled towels Intake/Output from previous day: 05/03 0701 - 05/04 0700 In: 3600 [P.O.:720; I.V.:2580; IV Piggyback:300] Out: 1380 [Urine:700; Drains:530; Blood:150] Intake/Output this shift:     Recent Labs  03/22/16 0418  HGB 9.3*    Recent Labs  03/22/16 0418  WBC 10.3  RBC 3.02*  HCT 26.6*  PLT 195    Recent Labs  03/22/16 0418  NA 133*  K 4.1  CL 100*  CO2 25  BUN 16  CREATININE 1.06  GLUCOSE 110*  CALCIUM 8.3*   No results for input(s): LABPT, INR in the last 72 hours.  EXAM General - Patient is Alert, Appropriate and Oriented Extremity - Neurologically intact Neurovascular intact Sensation intact distally Intact pulses distally Dorsiflexion/Plantar flexion intact Compartment soft Dressing - moderate drainage and bleeding noted to the proximal incision. appears to have stopped Motor Function - intact, moving foot and toes well on exam. Difficulty with doing SLR  Past Medical History  Diagnosis Date  . Glaucoma (increased eye pressure)   . Myocardial infarction ([place])   . Coronary artery disease   . CHF (congestive heart failure) ([place])   . Hypertension   . Anemia   . Dysrhythmia   . GERD  (gastroesophageal reflux disease)   . Cancer (HCC)     basal cell    Assessment/Plan: 1 Day Post-Op Procedure(s) (LRB): TOTAL HIP ARTHROPLASTY (Right) Active Problems:   S/P total hip arthroplasty  Estimated body mass index is 30.79 kg/(m^2) as calculated from the following:   Height as of this encounter: 5\' 10"  (1.778 m).   Weight as of this encounter: 97.342 kg (214 lb 9.6 oz). Advance diet Up with therapy D/C IV fluids Plan for discharge tomorrow Discharge home with home health  Labs: were revieed DVT Prophylaxis - Lovenox, Foot Pumps and TED hose Weight-Bearing as tolerated to right leg D/C O2 and Pulse OX and try on Room Air Begin working on having a bowel movement Regular diet Labs tomorrow morning   [name] R. Sheffield Newcomb 03/22/2016, 7:24 AM

## 2016-03-22 NOTE — Evaluation (Signed)
Physical Therapy Evaluation Patient Details Name: Derek Haney MRN: [id number] DOB: [birth year] Today's Date: 03/22/2016   History of Present Illness  Patient is an 82 yo male who was admitted to [facility] for elective right total hip replacement, posterior approach on 03/21/16 secondary to degenerative arthrosis.    Clinical Impression  Pt is a pleasant 82 y.o. M POD #1 following a R THA, posterior approach. Prior to admission, pt lived with wife and was independent with all ADLs. Pt occasionally used SPC for ambulation d/t hip pain. Pt awake and oriented during evaluation. Pt demonstrated good B UE and LE strength. Pt able to perform bed mobility with 2+ mod assist and use of bed railings. Pt able to transfer from EOB with 2+ mod assist and RW. Pt able to ambulate approx 3 ft with RW and 2+ mod assist for safety. OT was available in room to help with assist. Pt performed supine there-ex on R LE with mod to no assist. Pt demonstrates deficits in strength, ROM, and mobility. Pt would benefit from further skilled PT to address deficits; recommend pt sent to SNF after discharge from acute hospitalization.     Follow Up Recommendations SNF    Equipment Recommendations       Recommendations for Other Services       Precautions / Restrictions Precautions Precautions: Posterior Hip;Fall Precaution Booklet Issued: No Restrictions Weight Bearing Restrictions: Yes RLE Weight Bearing: Weight bearing as tolerated Other Position/Activity Restrictions: No adduction past neutral, no flexion past 70 degrees, no IR      Mobility  Bed Mobility Overal bed mobility: Needs Assistance;+ 2 for safety/equipment Bed Mobility: Supine to Sit     Supine to sit: +2 for physical assistance;Mod assist     General bed mobility comments: Pt able to perform bed mobility with 2+ mod assist. Pt required assist w/ moving R LE and to upright trunk. Pt provided heavy cues regarding hand placement during mobility. Once  sitting, pt required assist to scoot to EOB.   Transfers Overall transfer level: Needs assistance Equipment used: Rolling walker (2 wheeled) Transfers: Sit to/from Stand Sit to Stand: +2 physical assistance;Mod assist         General transfer comment: Pt able to transfer from EOB using 2+ mod assist. Pt required increased time to rise and upright posture. Pt provided cues regarding hand placement during transfer. Once standing, pt able to maintain WB precautions. Pt educated on precautions prior to standing.   Ambulation/Gait Ambulation/Gait assistance: Mod assist;+2 safety/equipment Ambulation Distance (Feet): 3 Feet Assistive device: Rolling walker (2 wheeled) Gait Pattern/deviations: Step-to pattern;Shuffle Gait velocity: decreased Gait velocity interpretation: <1.8 ft/sec, indicative of risk for recurrent falls General Gait Details: Pt able to ambulate approx 3 ft with use of RW and 2+ mod assist for safety. Pt demonstrated step-to gait pattern with short shuffle steps. When walking backwards, pt did not clear R LE off of ground. Pt provided heavy cues regarding walker and foot placement t/o ambulation. Pt demonstrated increased WB through B UE during ambulation. Ambulation limited by fatigue.   Stairs            Wheelchair Mobility    Modified Rankin (Stroke Patients Only)       Balance Overall balance assessment: Needs assistance Sitting-balance support: Bilateral upper extremity supported Sitting balance-Leahy Scale: Good Sitting balance - Comments: Pt able to maintain sitting balance on EOB with B UE supported for approx 2 mins.    Standing balance support: Bilateral upper extremity supported Standing  balance-Leahy Scale: Fair Standing balance comment: Pt able to stand with use of RW and min assist for safety. Pt required increased time to weight shift on R LE and maintain standing balance.                              Pertinent Vitals/Pain Pain  Assessment: 0-10 Pain Score: 1  Pain Location: R hip Pain Descriptors / Indicators: Operative site guarding Pain Intervention(s): Premedicated before session    Home Living Family/patient expects to be discharged to:: Private residence Living Arrangements: Spouse/significant other Available Help at Discharge: Family Type of Home: House Home Access: Stairs to enter Entrance Stairs-Rails: Psychiatric nurse of Steps: 3 Home Layout: Two level;Able to live on main level with bedroom/bathroom Home Equipment: Gilford Rile - 2 wheels;Shower seat - built in;Grab bars - tub/shower;Other (comment) (handicap commode) Additional Comments: Pt lives at home with wife who is available to assist t/o the day.     Prior Function Level of Independence: Independent         Comments: Pt performed all ADLs indepdently. Pt occasionally used SPC for ambulation d/t hip pain.      Hand Dominance   Dominant Hand: Right    Extremity/Trunk Assessment   Upper Extremity Assessment: RUE deficits/detail;LUE deficits/detail RUE Deficits / Details: R UE grossly 5/5 strength     LUE Deficits / Details: L UE grossly 5/5 strength   Lower Extremity Assessment: RLE deficits/detail;LLE deficits/detail RLE Deficits / Details: R LE grossly 4/5 strength, limited by pts pain.  LLE Deficits / Details: L LE grossly 5/5 strength     Communication   Communication: No difficulties  Cognition Arousal/Alertness: Awake/alert Behavior During Therapy: WFL for tasks assessed/performed Overall Cognitive Status: Within Functional Limits for tasks assessed       Memory: Decreased recall of precautions              General Comments      Exercises Other Exercises Other Exercises: Pt performed supine ther-ex including SLR and hip abd w/ mod assist, ankle pumps, quad sets and heel slides with no assist. All ther-ex performed x10 reps.       Assessment/Plan    PT Assessment Patient needs continued  PT services  PT Diagnosis Difficulty walking;Abnormality of gait;Acute pain   PT Problem List Decreased range of motion;Decreased strength;Decreased activity tolerance;Decreased mobility;Decreased knowledge of use of DME  PT Treatment Interventions DME instruction;Gait training;Stair training;Functional mobility training;Therapeutic activities;Therapeutic exercise   PT Goals (Current goals can be found in the Care Plan section) Acute Rehab PT Goals Patient Stated Goal: to be independent with ADLs PT Goal Formulation: With patient Time For Goal Achievement: 04/05/16 Potential to Achieve Goals: Good    Frequency BID   Barriers to discharge Decreased caregiver support      Co-evaluation               End of Session Equipment Utilized During Treatment: Gait belt Activity Tolerance: Patient tolerated treatment well Patient left: in chair;with call bell/phone within reach;with chair alarm set;Other (comment) (OT in room)           Time: [id number] PT Time Calculation (min) (ACUTE ONLY): 28 min   Charges:         PT G Codes:        Sherral Hammers Apr 17, 2016, 12:58 PM M. Barnett Abu, SPT

## 2016-03-22 NOTE — Care Management Note (Addendum)
Case Management Note  Patient Details  Name: Derek Haney MRN: [id number] Date of Birth: 03/01/1933  Subjective/Objective:                  Met with patient, his wife, and one of his son's to discuss discharge planning. Per Dr. Marry Guan per patient was charged ~$1,000 for his eye drops last visit however this writer is unable to access patient's previous visits. Patient states visit was related to his heart. He may have been in OBServation status and therefore routine meds would not be covered. I tried to explain but I don't think he understood. Patient requests to take his own eye drops this visit which was arranged with RN. Dr. Marry Guan was updated by text message. Patient wants to go home and became agitated when I mentioned rehab for a couple of weeks. His family calmed him down and compared it to his wife's stay at a SNF for rehab and patient seemed to agree if it was needed. He does agree to home health PT and would like to use [facility] (per wife). He became anxious about Lovenox injections when that was mentioned too. His son reassured him and patient calmed down. I reassured patient that we would have everything set up to meet his needs with his approval and that he was in control although it seemed that it wasn't- he appreciated that. Patient states he came to joint class but does not recall any of this information at that time however his son seemed to remember. He doesn't seem confused however I am concerned that he may become confused. Patient's room located across from nurses station; family at bedside. He has bedside commode and rolling walker available for use at home. PT evaluation pending. Pain med requested from RN prior to PT.   Action/Plan: List of home health agencies left with patient's wife. Referral to [facility]. Lovenox 56m #14 called in to [facility] [phone] price. RNCM will continue to follow.   Expected Discharge Date:                   Expected Discharge Plan:     In-House Referral:     Discharge planning Services  CM Consult  Post Acute Care Choice:  Home Health Choice offered to:  Patient, Spouse  DME Arranged:    DME Agency:     HH Arranged:  PT HEvart  AOwen Status of Service:  In process, will continue to follow  Medicare Important Message Given:    Date Medicare IM Given:    Medicare IM give by:    Date Additional Medicare IM Given:    Additional Medicare Important Message give by:     If discussed at [facility] Stay Meetings, dates discussed:    Additional Comments: Lovenox $64.  AMarshell Garfinkel RN 03/22/2016, 11:14 AM

## 2016-03-23 ENCOUNTER — Encounter
Admission: RE | Admit: 2016-03-23 | Discharge: 2016-03-23 | Disposition: A | Discharge status: Home / Self Care | Payer: Medicare Other | Source: Ambulatory Visit | Attending: Internal Medicine | Admitting: Internal Medicine

## 2016-03-23 DIAGNOSIS — Z471 Aftercare following joint replacement surgery: Secondary | ICD-10-CM | POA: Insufficient documentation

## 2016-03-23 LAB — BASIC METABOLIC PANEL
ANION GAP: 7 (ref 5–15)
BUN: 16 mg/dL (ref 6–20)
CALCIUM: 8.5 mg/dL — AB (ref 8.9–10.3)
CO2: 24 mmol/L (ref 22–32)
Chloride: 105 mmol/L (ref 101–111)
Creatinine, Ser: 1.06 mg/dL (ref 0.61–1.24)
Glucose, Bld: 142 mg/dL — ABNORMAL HIGH (ref 65–99)
Potassium: 3.7 mmol/L (ref 3.5–5.1)
SODIUM: 136 mmol/L (ref 135–145)

## 2016-03-23 LAB — CBC
HCT: 25.5 % — ABNORMAL LOW (ref 40.0–52.0)
Hemoglobin: 8.7 g/dL — ABNORMAL LOW (ref 13.0–18.0)
MCH: 30.2 pg (ref 26.0–34.0)
MCHC: 34.2 g/dL (ref 32.0–36.0)
MCV: 88.4 fL (ref 80.0–100.0)
PLATELETS: 188 10*3/uL (ref 150–440)
RBC: 2.88 MIL/uL — ABNORMAL LOW (ref 4.40–5.90)
RDW: 14 % (ref 11.5–14.5)
WBC: 11.2 10*3/uL — AB (ref 3.8–10.6)

## 2016-03-23 LAB — SURGICAL PATHOLOGY

## 2016-03-23 MED ORDER — PHENOL 1.4 % MT LIQD: 1.0000 | OROMUCOSAL | Status: DC | PRN

## 2016-03-23 NOTE — Progress Notes (Signed)
   Subjective: 2 Days Post-Op Procedure(s) (LRB): TOTAL HIP ARTHROPLASTY (Right) Patient reports pain as mild.   Patient is well, and has had no acute complaints or problems The patient ambulated 40 feet with physical therapy yesterday. Plan is to go to rehabilitation at [facility] after hospital stay. no nausea and no vomiting Patient denies any chest pains or shortness of breath.  Objective: Vital signs in last 24 hours: Temp:  [97.4 F (36.3 C)-98.9 F (37.2 C)] 98.9 F (37.2 C) (05/05 0435) Pulse Rate:  [66-70] 70 (05/05 0435) Resp:  [18] 18 (05/05 0435) BP: (98-126)/(50-62) 110/50 mmHg (05/05 0435) SpO2:  [91 %-94 %] 91 % (05/05 0435) well approximated incision Heels are non tender and elevated off the bed using rolled towels Intake/Output from previous day: 05/04 0701 - 05/05 0700 In: 480 [P.O.:480] Out: 1080 [Urine:900; Drains:180] Intake/Output this shift: Total I/O In: -  Out: 960 [Urine:900; Drains:60]   Recent Labs  03/22/16 0418 03/23/16 0349  HGB 9.3* 8.7*    Recent Labs  03/22/16 0418 03/23/16 0349  WBC 10.3 11.2*  RBC 3.02* 2.88*  HCT 26.6* 25.5*  PLT 195 188    Recent Labs  03/22/16 0418 03/23/16 0349  NA 133* 136  K 4.1 3.7  CL 100* 105  CO2 25 24  BUN 16 16  CREATININE 1.06 1.06  GLUCOSE 110* 142*  CALCIUM 8.3* 8.5*   No results for input(s): LABPT, INR in the last 72 hours.  EXAM General - Patient is Alert, Appropriate and Oriented Extremity - Neurologically intact Neurovascular intact Sensation intact distally Intact pulses distally Dorsiflexion/Plantar flexion intact Compartment soft Dressing - The bandage shows mild dried hemorrhage. The Hemovac was removed with no complication. Motor Function - intact, moving foot and toes well on exam. Difficulty with doing SLR. The patient ambulated 40 feet with physical therapy.  Past Medical History  Diagnosis Date  . Glaucoma (increased eye pressure)   . Myocardial  infarction ([place])   . Coronary artery disease   . CHF (congestive heart failure) ([place])   . Hypertension   . Anemia   . Dysrhythmia   . GERD (gastroesophageal reflux disease)   . Cancer (HCC)     basal cell    Assessment/Plan: 2 Days Post-Op Procedure(s) (LRB): TOTAL HIP ARTHROPLASTY (Right) Active Problems:   S/P total hip arthroplasty  Estimated body mass index is 30.79 kg/(m^2) as calculated from the following:   Height as of this encounter: 5\' 10"  (1.778 m).   Weight as of this encounter: 97.342 kg (214 lb 9.6 oz). The patient will do physical therapy today. The patient will be discharged to [facility] today or tomorrow. Plan for today though.  Labs: were revieed DVT Prophylaxis - Lovenox, Foot Pumps and TED hose Weight-Bearing as tolerated to right leg D/C O2 and Pulse OX and try on Room Air Continue working on having a bowel movement Regular diet  Reche Dixon PA-C [place] 03/23/2016, 6:31 AM

## 2016-03-23 NOTE — Progress Notes (Signed)
Pt discharged to [facility] place. Report called to [place]. [facility] provided transport. Family accompanied pt.

## 2016-03-23 NOTE — Clinical Social Work Placement (Signed)
   CLINICAL SOCIAL WORK PLACEMENT  NOTE  Date:  03/23/2016  Patient Details  Name: Derek Haney MRN: [id number] Date of Birth: 11-Aug-1933  Clinical Social Work is seeking post-discharge placement for this patient at the [place] level of care (*CSW will initial, date and re-position this form in  chart as items are completed):  Yes   Patient/family provided with [place] Work Department's list of facilities offering this level of care within the geographic area requested by the patient (or if unable, by the patient's family).  Yes   Patient/family informed of their freedom to choose among providers that offer the needed level of care, that participate in Medicare, Medicaid or managed care program needed by the patient, have an available bed and are willing to accept the patient.  Yes   Patient/family informed of [facility]'s ownership interest in [facility] and [facility], as well as of the fact that they are under no obligation to receive care at these facilities.  PASRR submitted to EDS on 03/22/16     PASRR number received on 03/22/16     Existing PASRR number confirmed on       FL2 transmitted to all facilities in geographic area requested by pt/family on 03/22/16     FL2 transmitted to all facilities within larger geographic area on       Patient informed that his/her managed care company has contracts with or will negotiate with certain facilities, including the following:        Yes   Patient/family informed of bed offers received.  Patient chooses bed at  [facility] )     Physician recommends and patient chooses bed at      Patient to be transferred to  [facility] ) on 03/23/16.  Patient to be transferred to facility by  [facility] )     Patient family notified on 03/23/16 of transfer.  Name of family member notified:   (Patient's wife [name] is at bedside and aware of D/C today. )     PHYSICIAN    Additional Comment:    _______________________________________________ Loralyn Freshwater, LCSW 03/23/2016, 10:41 AM

## 2016-03-23 NOTE — Discharge Summary (Signed)
Physician Discharge Summary  Subjective: 2 Days Post-Op Procedure(s) (LRB): TOTAL HIP ARTHROPLASTY (Right) Patient reports pain as moderate.   Patient seen in rounds with Dr. Marry Guan. Patient is well, and has had no acute complaints or problems Patient is ready to go to rehabilitation for physical therapy.  Physician Discharge Summary  Patient ID: Derek Haney MRN: [id number] DOB/AGE: December 24, 1932 82 y.o.  Admit date: 03/21/2016 Discharge date: 03/23/2016  Admission Diagnoses:  Discharge Diagnoses:  Active Problems:   S/P total hip arthroplasty   Discharged Condition: fair  Hospital Course: The patient is postop day 2 from a right total hip arthroplasty. The patient has done well since surgery. The patient did have a hemoglobin that dropped to 8.7 today. The patient is having no dizziness complaints. Derek Haney ambulated yesterday with 40 feet with physical therapy. The patient has not had a bowel movement yet. The patient is ready to go rehabilitation today.  Treatments: surgery:  PROCEDURE: Right total hip arthroplasty  SURGEON: Marciano Sequin. M.D.  ASSISTANT: Vance Peper, PA (present and scrubbed throughout the case, critical for assistance with exposure, retraction, instrumentation, and closure)  ANESTHESIA: spinal  ESTIMATED BLOOD LOSS: 150 mL  FLUIDS REPLACED: 1300 mL of crystalloid  DRAINS: 2 medium drains to a Hemovac reservoir  IMPLANTS UTILIZED: DePuy 16.5 mm large stature AML femoral stem, 60 mm OD [employer] acetabular component, 10 +4 mm neutral [employer] polyethylene insert, and a 36 mm M-SPEC +1.5 mm hip ball, 6.5 mm x 30 mm cancellous bone screw   Discharge Exam: Blood pressure 110/50, pulse 70, temperature 98.9 F (37.2 C), temperature source Oral, resp. rate 18, height 5\' 10"  (1.778 m), weight 97.342 kg (214 lb 9.6 oz), SpO2 91 %.   Disposition:   Discharge Instructions    Diet - low sodium heart healthy    Complete by:  As  directed      Increase activity slowly    Complete by:  As directed             Medication List    STOP taking these medications        aspirin 81 MG tablet      TAKE these medications        ACETAMINOPHEN 8 HOUR 650 MG CR tablet  Generic drug:  acetaminophen  Take 1,300 mg by mouth every 8 (eight) hours as needed for pain.     atorvastatin 20 MG tablet  Commonly known as:  LIPITOR  Take 20 mg by mouth daily at 6 PM.     cycloSPORINE 0.05 % ophthalmic emulsion  Commonly known as:  RESTASIS  Place 1 drop into both eyes 2 (two) times daily.     dorzolamide 2 % ophthalmic solution  Commonly known as:  TRUSOPT  Place 1 drop into both eyes 2 (two) times daily.     enoxaparin 40 MG/0.4ML injection  Commonly known as:  LOVENOX  Inject 0.4 mLs (40 mg total) into the skin daily.     furosemide 20 MG tablet  Commonly known as:  LASIX  Take 20 mg by mouth daily.     GLUCOSAMINE CHONDROITIN JOINT Tabs  Take 2 tablets by mouth every morning.     ISOSORBIDE MONONITRATE ER PO  Take 30 mg by mouth 3 (three) times daily.     LUMIGAN 0.01 % Soln  Generic drug:  bimatoprost  Place 1 drop into both eyes at bedtime.     oxyCODONE 5 MG immediate release tablet  Commonly known as:  Oxy IR/ROXICODONE  Take 1-2 tablets (5-10 mg total) by mouth every 4 (four) hours as needed for severe pain.     pantoprazole 40 MG tablet  Commonly known as:  PROTONIX  Take 40 mg by mouth daily.     phenol 1.4 % Liqd  Commonly known as:  CHLORASEPTIC  Use as directed 1 spray in the mouth or throat as needed for throat irritation / pain.     potassium chloride SA 20 MEQ tablet  Commonly known as:  K-DUR,KLOR-CON  Take 20 mEq by mouth daily.     propranolol 20 MG tablet  Commonly known as:  INDERAL  Take 20 mg by mouth 4 (four) times daily.     SYSTANE ULTRA OP  Apply 2 drops to eye 2 (two) times daily.     traMADol 50 MG tablet  Commonly known as:  ULTRAM  Take 1-2 tablets (50-100 mg  total) by mouth every 4 (four) hours as needed for moderate pain.     traZODone 100 MG tablet  Commonly known as:  DESYREL  Take 100 mg by mouth at bedtime.           Follow-up Information    Follow up with [name], MD On 05/03/2016.   Specialty:  Orthopedic Surgery   Why:  at 9:45am   Contact information:   [place] [address] [phone]       Follow up with [facility] .   Specialty:  [place] information:   [address] [place] [phone]      Signed: Prescott Parma, TODD 03/23/2016, 6:36 AM   Objective: Vital signs in last 24 hours: Temp:  [97.4 F (36.3 C)-98.9 F (37.2 C)] 98.9 F (37.2 C) (05/05 0435) Pulse Rate:  [66-70] 70 (05/05 0435) Resp:  [18] 18 (05/05 0435) BP: (98-126)/(50-62) 110/50 mmHg (05/05 0435) SpO2:  [91 %-94 %] 91 % (05/05 0435)  Intake/Output from previous day:  Intake/Output Summary (Last 24 hours) at 03/23/16 0636 Last data filed at 03/23/16 0400  Gross per 24 hour  Intake    480 ml  Output   1080 ml  Net   -600 ml    Intake/Output this shift: Total I/O In: -  Out: 960 [Urine:900; Drains:60]  Labs:  Recent Labs  03/22/16 0418 03/23/16 0349  HGB 9.3* 8.7*    Recent Labs  03/22/16 0418 03/23/16 0349  WBC 10.3 11.2*  RBC 3.02* 2.88*  HCT 26.6* 25.5*  PLT 195 188    Recent Labs  03/22/16 0418 03/23/16 0349  NA 133* 136  K 4.1 3.7  CL 100* 105  CO2 25 24  BUN 16 16  CREATININE 1.06 1.06  GLUCOSE 110* 142*  CALCIUM 8.3* 8.5*   No results for input(s): LABPT, INR in the last 72 hours.  EXAM: General - Patient is Alert and Oriented Extremity - Neurovascular intact Dorsiflexion/Plantar flexion intact No cellulitis present Compartment soft Incision - clean, dry, blood tinged and scant drainage Motor Function -  the patient is able to roll over with mild assistance. Derek Haney ambulated 40 feet. Derek Haney  has dorsiflexion and plantarflexion that are intact.  Assessment/Plan: 2 Days Post-Op Procedure(s) (LRB): TOTAL HIP ARTHROPLASTY (Right) Procedure(s) (LRB): TOTAL HIP ARTHROPLASTY (Right) Past Medical History  Diagnosis Date  . Glaucoma (increased eye pressure)   . Myocardial infarction ([place])   . Coronary artery disease   .  CHF (congestive heart failure) ([place])   . Hypertension   . Anemia   . Dysrhythmia   . GERD (gastroesophageal reflux disease)   . Cancer ([place])     basal cell   Active Problems:   S/P total hip arthroplasty  Estimated body mass index is 30.79 kg/(m^2) as calculated from the following:   Height as of this encounter: 5\' 10"  (1.778 m).   Weight as of this encounter: 97.342 kg (214 lb 9.6 oz). Discharge to SNF Diet - Regular diet Follow up - in 6 weeks Activity - WBAT Disposition - Rehab Condition Upon Discharge - Stable DVT Prophylaxis - Lovenox and TED hose  Reche Dixon, PA-C Orthopaedic Surgery 03/23/2016, 6:36 AM

## 2016-03-23 NOTE — Progress Notes (Signed)
Checked to see if pre-authorization would be needed for non-emergent EMS transport. Per [employer] benefits obtained online through [employer], patient has a [employer] Medicare Advantage PPO policy.  Medicare PPO plans do not require pre-auth for non-emergent ground transports using service codes A0426 or A0428.   

## 2016-03-23 NOTE — Progress Notes (Signed)
Physical Therapy Treatment Patient Details Name: Derek Haney MRN: [id number] DOB: Jul 12, 1933 Today's Date: 03/23/2016    History of Present Illness Patient is an 82 yo male who was admitted to [facility] for elective right total hip replacement, posterior approach on 03/21/16 secondary to degenerative arthrosis.      PT Comments    Pt is progressing towards goals. Pt able to perform bed mobility with max assist and use of bed railings. Pt able to transfer from EOB with RW and mod assist. Pt able to ambulate approx 60 ft using RW and mod assist, requiring heavy cues for sequencing. Pt performed toileting, requiring max assist to rise from [facility]. Pt performed supine there-ex with min assist. Pt demonstrates deficits in strength, ROM, and mobility. Pt would benefit from further skilled PT to address deficits; recommend pt sent to SNF after discharge from acute hospitalization.   Follow Up Recommendations  SNF     Equipment Recommendations       Recommendations for Other Services       Precautions / Restrictions Precautions Precautions: Posterior Hip;Fall Precaution Booklet Issued: Yes (comment) Restrictions Weight Bearing Restrictions: Yes RLE Weight Bearing: Weight bearing as tolerated Other Position/Activity Restrictions: No adduction past neutral, no flexion past 70 degrees, no IR    Mobility  Bed Mobility Overal bed mobility: Needs Assistance Bed Mobility: Supine to Sit     Supine to sit: Max assist     General bed mobility comments: Pt able to perform bed mobility with max assist and use of bed railings. Pt required assist with moving B LE and to upright trunk. Pt provided heavy cues regarding hand placement during mobility. Pt required assist to scoot to EOB and place L LE on ground.   Transfers Overall transfer level: Needs assistance Equipment used: Rolling walker (2 wheeled) Transfers: Sit to/from Stand Sit to Stand: Mod assist         General transfer comment: Pt able  to transfer from EOB using RW and mod assist. Pt provided heavy cues regarding hand and L LE placement prior to standing. Once standing, pt required heavy cues and increased time to upright posture and maintain WB on L LE.   Ambulation/Gait Ambulation/Gait assistance: Mod assist Ambulation Distance (Feet): 60 Feet Assistive device: Rolling walker (2 wheeled) Gait Pattern/deviations: Step-to pattern;Decreased stance time - right;Decreased step length - left Gait velocity: decreased   General Gait Details: Pt able to ambulate approx 60 ft using RW and mod assist. Pt provided heavy cues regarding foot and walker placement throughout ambulation. Pt demosntrated slow, step-to gait pattern with decreased stance time on R LE and decreased step length on the L LE. Pt cued on proper way to turn with precautions. Pt demonstrated safe technique with turns.    Stairs            Wheelchair Mobility    Modified Rankin (Stroke Patients Only)       Balance                                    Cognition Arousal/Alertness: Awake/alert Behavior During Therapy: WFL for tasks assessed/performed Overall Cognitive Status: Within Functional Limits for tasks assessed                      Exercises Other Exercises Other Exercises: Pt performed supine ther-ex on B LE including quad sets, heel slides, and hip/ad with min assist.  All ther-ex performed x12 reps. Other Exercises: Pt performed toileting on BSC in bathroom. Pt required max assist and heavy cues to rise from [facility]. Pt able to perform all other toileting independently.     General Comments        Pertinent Vitals/Pain Pain Assessment: 0-10 Pain Score: 3  Pain Location: R hip Pain Descriptors / Indicators: Operative site guarding Pain Intervention(s): Limited activity within patient's tolerance    Home Living                      Prior Function            PT Goals (current goals can now be found in  the care plan section) Acute Rehab PT Goals Patient Stated Goal: to be independent with ADLs PT Goal Formulation: With patient Time For Goal Achievement: 04/05/16 Potential to Achieve Goals: Good Progress towards PT goals: Progressing toward goals    Frequency  BID    PT Plan Current plan remains appropriate    Co-evaluation             End of Session Equipment Utilized During Treatment: Gait belt Activity Tolerance: Patient tolerated treatment well Patient left: in chair;with call bell/phone within reach;with chair alarm set;with family/visitor present     Time: [id number] PT Time Calculation (min) (ACUTE ONLY): 39 min  Charges:                       G Codes:      Sherral Hammers 04/15/16, 12:47 PM M. Barnett Abu, SPT

## 2016-03-23 NOTE — Care Management (Signed)
Patient discharge to [facility] today per CSW. No further RNCM needs. Lovenox cancelled with [facility]. [facility] notiied of change in discharge plan.

## 2016-03-23 NOTE — Progress Notes (Signed)
Patient is medically stable for D/C to [facility] today. Per Kim admissions coordinator at [facility] patient will go to room 208-B. RN will call report at [phone] and arrange EMS for transport pending BM. Clinical Education officer, museum (CSW) sent D/C Summary, FL2 and D/C Packet to [employer] via [employer]. Patient is aware of above. Patient's wife [name] is at bedside and aware of above. Please reconsult if future social work needs arise. CSW signing off.   Blima Rich, LCSW [phone]

## 2016-03-27 DIAGNOSIS — Z471 Aftercare following joint replacement surgery: Secondary | ICD-10-CM | POA: Diagnosis present

## 2016-03-27 LAB — COMPREHENSIVE METABOLIC PANEL
ALBUMIN: 2.9 g/dL — AB (ref 3.5–5.0)
ALT: 28 U/L (ref 17–63)
ANION GAP: 9 (ref 5–15)
AST: 32 U/L (ref 15–41)
Alkaline Phosphatase: 86 U/L (ref 38–126)
BILIRUBIN TOTAL: 1.3 mg/dL — AB (ref 0.3–1.2)
BUN: 15 mg/dL (ref 6–20)
CHLORIDE: 98 mmol/L — AB (ref 101–111)
CO2: 26 mmol/L (ref 22–32)
Calcium: 8.8 mg/dL — ABNORMAL LOW (ref 8.9–10.3)
Creatinine, Ser: 0.97 mg/dL (ref 0.61–1.24)
GFR calc Af Amer: >60 mL/min (ref >60)
GFR calc non Af Amer: >60 mL/min (ref >60)
GLUCOSE: 148 mg/dL — AB (ref 65–99)
POTASSIUM: 3.7 mmol/L (ref 3.5–5.1)
SODIUM: 133 mmol/L — AB (ref 135–145)
TOTAL PROTEIN: 6.6 g/dL (ref 6.5–8.1)

## 2016-03-27 LAB — CBC WITH DIFFERENTIAL/PLATELET
Basophils Absolute: 0.1 10*3/uL (ref 0–0.1)
EOS ABS: 0.8 10*3/uL — AB (ref 0–0.7)
HCT: 28.4 % — ABNORMAL LOW (ref 40.0–52.0)
Hemoglobin: 9.5 g/dL — ABNORMAL LOW (ref 13.0–18.0)
Lymphocytes Relative: 24 %
Lymphs Abs: 2.9 10*3/uL (ref 1.0–3.6)
MCH: 29.7 pg (ref 26.0–34.0)
MCHC: 33.5 g/dL (ref 32.0–36.0)
MCV: 88.6 fL (ref 80.0–100.0)
MONO ABS: 1.5 10*3/uL — AB (ref 0.2–1.0)
Neutro Abs: 7.1 10*3/uL — ABNORMAL HIGH (ref 1.4–6.5)
Neutrophils Relative %: 57 %
Platelets: 301 10*3/uL (ref 150–440)
RBC: 3.2 MIL/uL — ABNORMAL LOW (ref 4.40–5.90)
RDW: 14 % (ref 11.5–14.5)
WBC: 12.3 10*3/uL — ABNORMAL HIGH (ref 3.8–10.6)

## 2016-04-03 DIAGNOSIS — Z471 Aftercare following joint replacement surgery: Secondary | ICD-10-CM | POA: Diagnosis not present

## 2016-04-03 LAB — CBC WITH DIFFERENTIAL/PLATELET
BASOS ABS: 0.1 10*3/uL (ref 0–0.1)
Basophils Relative: 1 %
EOS ABS: 0.8 10*3/uL — AB (ref 0–0.7)
HCT: 27.1 % — ABNORMAL LOW (ref 40.0–52.0)
Hemoglobin: 9.1 g/dL — ABNORMAL LOW (ref 13.0–18.0)
Lymphocytes Relative: 22 %
Lymphs Abs: 2.8 10*3/uL (ref 1.0–3.6)
MCH: 29.2 pg (ref 26.0–34.0)
MCHC: 33.7 g/dL (ref 32.0–36.0)
MCV: 86.9 fL (ref 80.0–100.0)
MONO ABS: 1.2 10*3/uL — AB (ref 0.2–1.0)
Monocytes Relative: 10 %
Neutro Abs: 7.8 10*3/uL — ABNORMAL HIGH (ref 1.4–6.5)
Neutrophils Relative %: 61 %
PLATELETS: 486 10*3/uL — AB (ref 150–440)
RBC: 3.12 MIL/uL — AB (ref 4.40–5.90)
RDW: 14.1 % (ref 11.5–14.5)
WBC: 12.8 10*3/uL — AB (ref 3.8–10.6)

## 2016-04-03 LAB — COMPREHENSIVE METABOLIC PANEL
ALT: 19 U/L (ref 17–63)
ANION GAP: 8 (ref 5–15)
AST: 22 U/L (ref 15–41)
Albumin: 3.1 g/dL — ABNORMAL LOW (ref 3.5–5.0)
Alkaline Phosphatase: 96 U/L (ref 38–126)
BUN: 16 mg/dL (ref 6–20)
CALCIUM: 9.2 mg/dL (ref 8.9–10.3)
CO2: 26 mmol/L (ref 22–32)
CREATININE: 1.07 mg/dL (ref 0.61–1.24)
Chloride: 104 mmol/L (ref 101–111)
GLUCOSE: 96 mg/dL (ref 65–99)
Potassium: 3.8 mmol/L (ref 3.5–5.1)
SODIUM: 138 mmol/L (ref 135–145)
TOTAL PROTEIN: 6.8 g/dL (ref 6.5–8.1)
Total Bilirubin: 0.7 mg/dL (ref 0.3–1.2)

## 2016-07-16 DIAGNOSIS — E782 Mixed hyperlipidemia: Secondary | ICD-10-CM | POA: Insufficient documentation

## 2016-07-16 DIAGNOSIS — K219 Gastro-esophageal reflux disease without esophagitis: Secondary | ICD-10-CM | POA: Insufficient documentation

## 2017-01-04 IMAGING — DX DG HIP (WITH OR WITHOUT PELVIS) 1V PORT*R*
3 series · 3 of 3 positions shown · non-contrast
Comparison: None.

CLINICAL DATA: RIGHT hip arthroplasty

EXAM:
DG HIP (WITH OR WITHOUT PELVIS) 1V PORT RIGHT

[pelvis ap (1 of 2)]
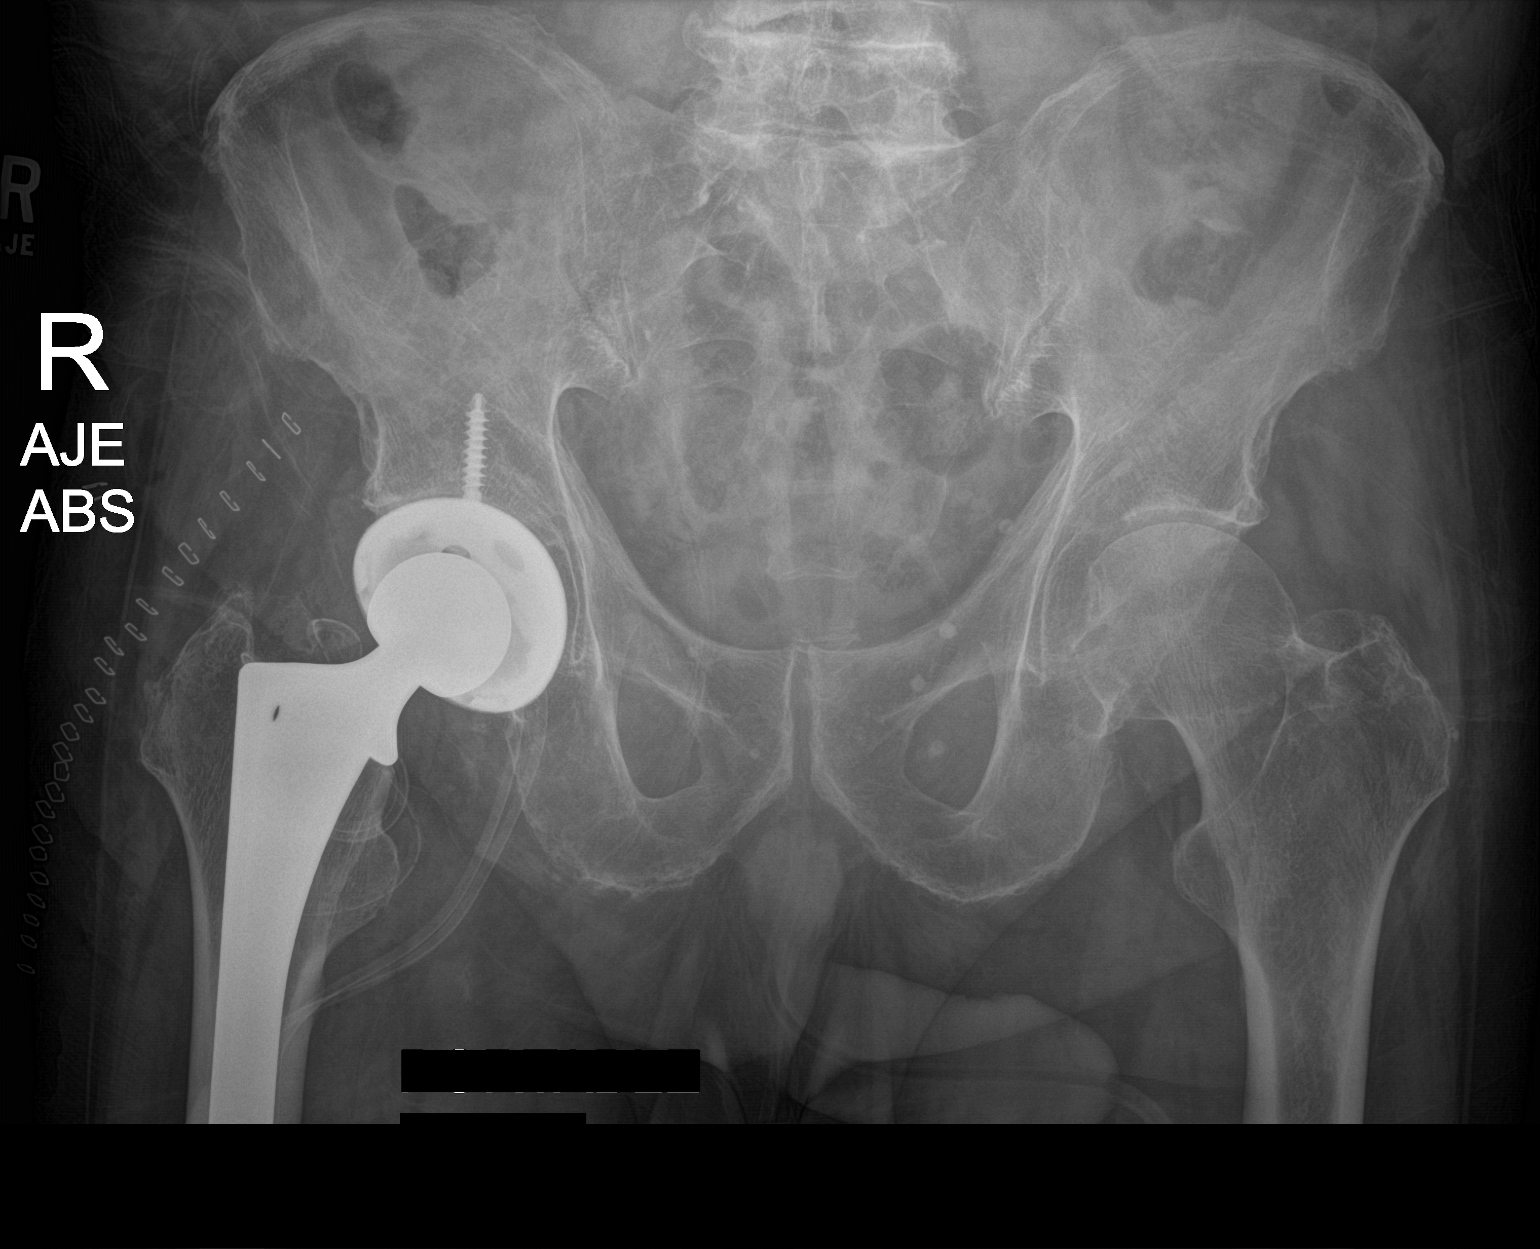

[hip lat]
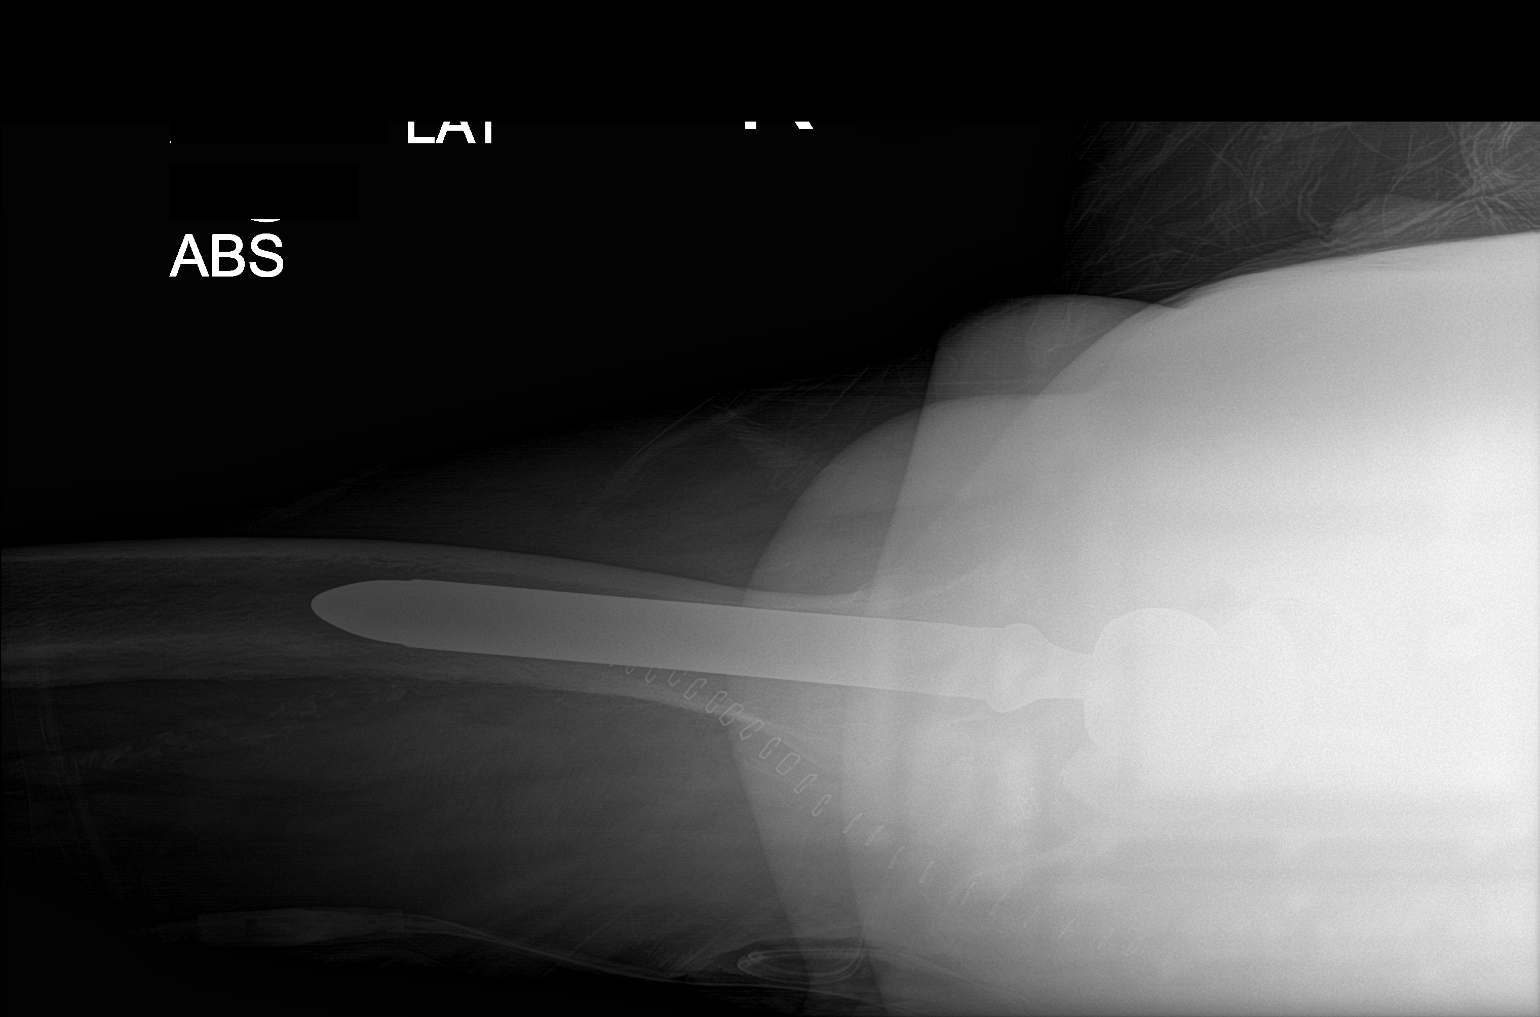

[pelvis ap (2 of 2)]
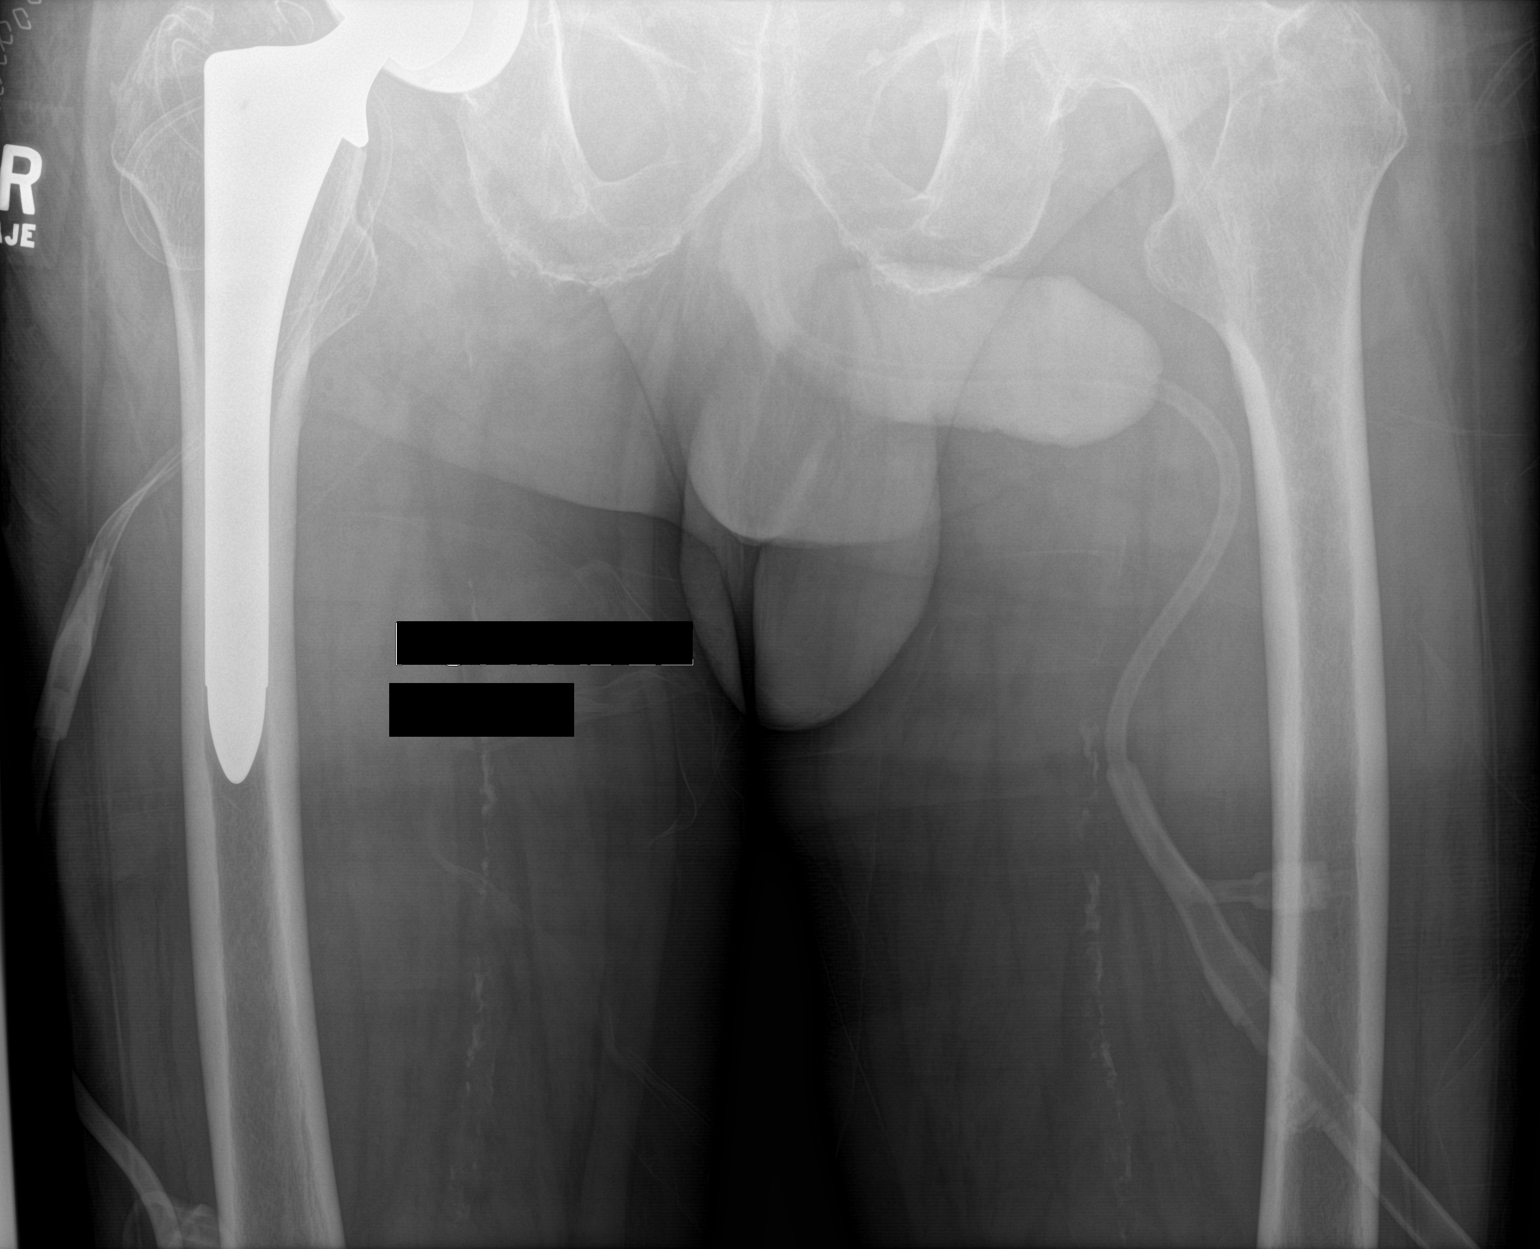

[3 of 3 positions shown; findings below may reference images not displayed]

FINDINGS: RIGHT total hip arthroplasty obtained. No fracture dislocation.
Surgical drain place. No fracture a dislocation.

The tip of the Foley catheter appears to be within the urethra.  R
IMPRESSION: 1. RIGHT hip arthroplasty without complication.
2. The tip of the Foley catheter appears to be within the prostatic
urethra. Recommend clinical correlation and replacement or removal.
These results will be called to the ordering clinician or
representative by the Radiologist Assistant, and communication
documented in the PACS or zVision Dashboard.

## 2018-01-16 DIAGNOSIS — R001 Bradycardia, unspecified: Secondary | ICD-10-CM | POA: Insufficient documentation

## 2018-02-10 ENCOUNTER — Other Ambulatory Visit: Payer: Self-pay | Admitting: Internal Medicine

## 2018-02-10 DIAGNOSIS — R9389 Abnormal findings on diagnostic imaging of other specified body structures: Secondary | ICD-10-CM

## 2018-02-25 ENCOUNTER — Ambulatory Visit
Admission: RE | Admit: 2018-02-25 | Discharge: 2018-02-25 | Disposition: A | Discharge status: Home / Self Care | Payer: Medicare Other | Source: Ambulatory Visit | Attending: Internal Medicine | Admitting: Internal Medicine

## 2018-02-25 DIAGNOSIS — I700 Atherosclerosis of aorta: Secondary | ICD-10-CM | POA: Diagnosis not present

## 2018-02-25 DIAGNOSIS — J984 Other disorders of lung: Secondary | ICD-10-CM | POA: Insufficient documentation

## 2018-02-25 DIAGNOSIS — J479 Bronchiectasis, uncomplicated: Secondary | ICD-10-CM | POA: Insufficient documentation

## 2018-02-25 DIAGNOSIS — R9389 Abnormal findings on diagnostic imaging of other specified body structures: Secondary | ICD-10-CM | POA: Insufficient documentation

## 2018-02-25 DIAGNOSIS — R918 Other nonspecific abnormal finding of lung field: Secondary | ICD-10-CM | POA: Insufficient documentation

## 2018-02-25 DIAGNOSIS — I2510 Atherosclerotic heart disease of native coronary artery without angina pectoris: Secondary | ICD-10-CM | POA: Diagnosis not present

## 2018-02-25 LAB — POCT I-STAT CREATININE: Creatinine, Ser: 1 mg/dL (ref 0.61–1.24)

## 2018-02-25 MED ORDER — IOHEXOL 300 MG/ML  SOLN
75.0000 mL | Freq: Once | INTRAMUSCULAR | Status: AC | PRN
  Administered 2018-02-25: 75 mL via INTRAVENOUS

## 2018-03-06 ENCOUNTER — Encounter: Payer: Self-pay | Admitting: Pulmonary Disease

## 2018-03-06 ENCOUNTER — Ambulatory Visit: Payer: Medicare Other | Admitting: Pulmonary Disease

## 2018-03-06 VITALS — BP 140/72 | HR 67 | Ht 70.0 in | Wt 204.0 lb

## 2018-03-06 DIAGNOSIS — Z87891 Personal history of nicotine dependence: Secondary | ICD-10-CM | POA: Diagnosis not present

## 2018-03-06 DIAGNOSIS — I272 Pulmonary hypertension, unspecified: Secondary | ICD-10-CM | POA: Diagnosis not present

## 2018-03-06 DIAGNOSIS — R918 Other nonspecific abnormal finding of lung field: Secondary | ICD-10-CM

## 2018-03-06 DIAGNOSIS — J479 Bronchiectasis, uncomplicated: Secondary | ICD-10-CM | POA: Diagnosis not present

## 2018-03-06 DIAGNOSIS — R0609 Other forms of dyspnea: Secondary | ICD-10-CM

## 2018-03-06 MED ORDER — UMECLIDINIUM-VILANTEROL 62.5-25 MCG/INH IN AEPB
1.0000 | INHALATION_SPRAY | Freq: Every day | RESPIRATORY_TRACT | 10 refills | Status: DC
Start: 2018-03-06 — End: 2018-06-03

## 2018-03-06 NOTE — Patient Instructions (Addendum)
Trial of Anoro inhaler - one inhalation daily in morning Full lung function tests ordered (PFTs) Follow up in 4-6 weeks I will try to track down results of recent echoardiogram and we will discuss any important findings at next visit

## 2018-03-06 NOTE — Progress Notes (Addendum)
PULMONARY CONSULT NOTE  Requesting MD/Service: Sabra Heck Date of initial consultation: 03/06/18 Reason for consultation: Multiple pulmonary nodular opacities  PT PROFILE: 84 y.o. male Former smoker (remote, quit 30 yrs ago) referred for evaluation of abnormalities noted on CT chest  DATA: 06/02/12 CT chest: (report only) Small pulmonary nodule noted in the right midlung field just posterior to the right major fissure. This is nonspecific and measures approximately 2 mm.Tiny nodular density right lung base  01/16/18 Myoview: Moderate fixed inferior myocardial perfusion defect consistent with possible previous infarct and/or scar or diaphragmatic attenuation without evidence of myocardial ischemia 02/25/18 CT chest: Mild diffuse bilateral bronchiectasis. Interval development of multiple clusters of nodules seen in the lingula, as well as LLL. The largest such cluster is in the posterior LLL with the largest nodule measuring 11 mm. New 6 mm subpleural nodule is noted in RLL. These nodules are most likely inflammatory in etiology 03/12/16 Echocardiogram: Normal LVEF, mild MR, mild elevation in estimated RVSP (41 mmHg)  HPI:  Initially evaluated by Dr Sabra Heck for DOE. CXR (report only available) revealed scattered interstitial and parenchymal  Densities in both lungs. CT chest was recommended by radiologist. Results of that study noted above. Pt referred for evaluation of dyspnea and CT chest findings.   He reports exertional dyspnea over the past year or two.  It has been minimally progressive.  It is moderate in severity.  He notices it when he is walking outside in his chart and when exercising on his treadmill.  There is little day-to-day variation.  He denies cough, mucus production, hemoptysis, chest pain, orthopnea, paroxysmal nocturnal dyspnea.  He has very mild intermittent ankle and pretibial edema.  He essentially quit smoking approximately 30 years ago but admits that he smoked an occasional  cigarette from time to time up until 5 years ago.  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    In evaluation of the above symptoms, a chest x-ray was performed with a recommendation for a follow-up CT scan.  The results of this are discussed above.  He has also undergone a cardiac evaluation with a normal Myoview recently.  Reportedly, an echocardiogram has been done recently but the results of that study are not available to me at this time.   Past Medical History:  Diagnosis Date  . Anemia   . Cancer ([place])    basal cell  . CHF (congestive heart failure) ([place])   . Coronary artery disease   . Dysrhythmia   . GERD (gastroesophageal reflux disease)   . Glaucoma (increased eye pressure)   . Hypertension   . Myocardial infarction [facility])     Past Surgical History:  Procedure Laterality Date  . CORONARY ANGIOPLASTY    . TONSILLECTOMY    . TOTAL HIP ARTHROPLASTY Right 03/21/2016   Procedure: TOTAL HIP ARTHROPLASTY;  Surgeon: [name], MD;  Location: [facility] ORS;  Service: Orthopedics;  Laterality: Right;    MEDICATIONS: I have reviewed all medications and confirmed regimen as documented  Social History   Socioeconomic History  . Marital status: Married    Spouse name: Not on file  . Number of children: Not on file  . Years of education: Not on file  . Highest education level: Not on file  Occupational History  . Not on file  Social Needs  . Financial resource strain: Not on file  . Food insecurity:  Worry: Not on file    Inability: Not on file  . Transportation needs:    Medical: Not on file    Non-medical: Not on file  Tobacco Use  . Smoking status: Former Smoker    Packs/day: 1.00    Types: Cigarettes  . Smokeless tobacco: Former Systems developer    Types: Chew  Substance and Sexual Activity  . Alcohol use: Yes    Comment: occ  . Drug use: No  .  Sexual activity: Not on file  Lifestyle  . Physical activity:    Days per week: Not on file    Minutes per session: Not on file  . Stress: Not on file  Relationships  . Social connections:    Talks on phone: Not on file    Gets together: Not on file    Attends religious service: Not on file    Active member of club or organization: Not on file    Attends meetings of clubs or organizations: Not on file    Relationship status: Not on file  . Intimate partner violence:    Fear of current or ex partner: Not on file    Emotionally abused: Not on file    Physically abused: Not on file    Forced sexual activity: Not on file  Other Topics Concern  . Not on file  Social History Narrative  . Not on file    History reviewed. No pertinent family history.  ROS: No fever, myalgias/arthralgias, unexplained weight loss or weight gain No new focal weakness or sensory deficits No otalgia, hearing loss, visual changes, nasal and sinus symptoms, mouth and throat problems No neck pain or adenopathy No abdominal pain, N/V/D, diarrhea, change in bowel pattern No dysuria, change in urinary pattern   Vitals:   03/06/18 0925 03/06/18 0931  BP:  140/72  Pulse:  67  SpO2:  97%  Weight: 204 lb (92.5 kg)   Height: 5\' 10"  (1.778 m)      EXAM:  Gen: WDWN, No overt respiratory distress HEENT: NCAT, sclera white, oropharynx normal Neck: Supple without LAN, thyromegaly, JVD Lungs: breath sounds moderately diminished with normal percussion note and no adventitious sounds Cardiovascular: RRR, no murmurs noted Abdomen: Soft, nontender, normal BS Ext: without clubbing, cyanosis.  Trace symmetric ankle edema. Neuro: CNs grossly intact, motor and sensory intact Skin: Limited exam, no lesions noted  DATA:   BMP Latest Ref Rng & Units 02/25/2018 04/03/2016 03/27/2016  Glucose 65 - 99 mg/dL - 96 148(H)  BUN 6 - 20 mg/dL - 16 15  Creatinine 0.61 - 1.24 mg/dL 1.00 1.07 0.97  Sodium 135 - 145 mmol/L - 138  133(L)  Potassium 3.5 - 5.1 mmol/L - 3.8 3.7  Chloride 101 - 111 mmol/L - 104 98(L)  CO2 22 - 32 mmol/L - 26 26  Calcium 8.9 - 10.3 mg/dL - 9.2 8.8(L)    CBC Latest Ref Rng & Units 04/03/2016 03/27/2016 03/23/2016  WBC 3.8 - 10.6 K/uL 12.8(H) 12.3(H) 11.2(H)  Hemoglobin 13.0 - 18.0 g/dL 9.1(L) 9.5(L) 8.7(L)  Hematocrit 40.0 - 52.0 % 27.1(L) 28.4(L) 25.5(L)  Platelets 150 - 440 K/uL 486(H) 301 188    CXR: No recent film  I have personally reviewed all chest radiographs reported above including CXRs and CT chest unless otherwise indicated  IMPRESSION:     ICD-10-CM   1. DOE (dyspnea on exertion) R06.09 Pulmonary Function Test [facility] Only  2. Multiple pulmonary nodules in background of bronciectasis - this appears to be  a process that has been present at least since 2013 and appears to be slowly progressive. Strongly doubt maliganancy R91.8   3. Mild bronchiectasis J47.9   4. Former smoker Z87.891 Pulmonary Function Test [facility] Only  5. Pulmonary hypertension suggested by echo ardiogram 2017 I27.20    Moderate exertional dyspnea likely due to COPD  The CT scan findings of multiple pulmonary nodules and very mild bronchiectasis are probably not of significance and are not likely contributing to his symptoms.  These findings do not warrant further evaluation.  Pulmonary hypertension was suggested on an echocardiogram from 2017.  Reportedly, he has had a more recent echocardiogram done (this year) but these results are not in Care Everywhere  PLAN:  Trial of Anoro inhaler, 1 inhalation daily.  I have provided a sample and prescription has been entered.    Full PFTs ordered  I will try to track down results of recent echocardiogram.  If significant pulmonary hypertension is noted, further evaluation might be warranted  Follow-up in 4-6 weeks   Merton Border, MD PCCM service Mobile [phone] Pager [phone] 03/06/2018 10:10 AM

## 2018-04-03 ENCOUNTER — Ambulatory Visit: Payer: Medicare Other | Attending: Pulmonary Disease

## 2018-04-03 DIAGNOSIS — Z87891 Personal history of nicotine dependence: Secondary | ICD-10-CM | POA: Diagnosis not present

## 2018-04-03 DIAGNOSIS — R0609 Other forms of dyspnea: Secondary | ICD-10-CM | POA: Diagnosis not present

## 2018-04-03 MED ORDER — ALBUTEROL SULFATE (2.5 MG/3ML) 0.083% IN NEBU
2.5000 mg | INHALATION_SOLUTION | Freq: Once | RESPIRATORY_TRACT | Status: AC
  Administered 2018-04-03: 2.5 mg via RESPIRATORY_TRACT
  Filled 2018-04-03: qty 3

## 2018-04-10 ENCOUNTER — Ambulatory Visit: Payer: Medicare Other | Admitting: Pulmonary Disease

## 2018-04-10 ENCOUNTER — Encounter: Payer: Self-pay | Admitting: Pulmonary Disease

## 2018-04-10 VITALS — BP 112/70 | HR 68 | Ht 70.0 in | Wt 208.0 lb

## 2018-04-10 DIAGNOSIS — R918 Other nonspecific abnormal finding of lung field: Secondary | ICD-10-CM

## 2018-04-10 DIAGNOSIS — I272 Pulmonary hypertension, unspecified: Secondary | ICD-10-CM

## 2018-04-10 DIAGNOSIS — J449 Chronic obstructive pulmonary disease, unspecified: Secondary | ICD-10-CM | POA: Diagnosis not present

## 2018-04-10 DIAGNOSIS — R0609 Other forms of dyspnea: Secondary | ICD-10-CM | POA: Diagnosis not present

## 2018-04-10 DIAGNOSIS — Z87891 Personal history of nicotine dependence: Secondary | ICD-10-CM

## 2018-04-10 NOTE — Progress Notes (Signed)
PULMONARY CONSULT NOTE  Requesting MD/Service: Sabra Heck Date of initial consultation: 03/06/18 Reason for consultation: Multiple pulmonary nodular opacities  PT PROFILE: 84 y.o. male Former smoker (remote, quit 30 yrs ago) referred for evaluation of abnormalities noted on CT chest  DATA: 06/02/12 CT chest: (report only) Small pulmonary nodule noted in the right midlung field just posterior to the right major fissure. This is nonspecific and measures approximately 2 mm.Tiny nodular density right lung base  01/16/18 Myoview: Moderate fixed inferior myocardial perfusion defect consistent with possible previous infarct and/or scar or diaphragmatic attenuation without evidence of myocardial ischemia 02/25/18 CT chest: Mild diffuse bilateral bronchiectasis. Interval development of multiple clusters of nodules seen in the lingula, as well as LLL. The largest such cluster is in the posterior LLL with the largest nodule measuring 11 mm. New 6 mm subpleural nodule is noted in RLL. These nodules are most likely inflammatory in etiology 03/12/16 Echocardiogram: Normal LVEF, mild MR, mild elevation in estimated RVSP (41 mmHg) 04/03/18 PFTs: FVC: 2.62 > 2.61 L (62 %pred), FEV1: 1.45 > 1.63 L (45 > 51 %pred), FEV1/FVC: 55 > 63%, TLC: 8.25 L (121 %pred), FRC 6.33 L, 162 % pred, DLCO 49 %pred, DLCO/VA 76 %pred. Lung volume measurements might not be accurate due to leak  INTERVAL: No major events  SUBJ:  Initially seen 4/18 with presumptive diagnosis of COPD.  Trial of Anoro inhaler initiated.  PFTs ordered and are documented above.  Returns today for routine reevaluation.  Initially, he felt the Anoro was significantly beneficial.  However, now he states that its benefit seems to wear off in the early afternoon.  Otherwise, he has no new complaints. Denies CP, fever, purulent sputum, hemoptysis, LE edema and calf tenderness.   OBJ: Vitals:   04/10/18 0914 04/10/18 0919  BP:  112/70  Pulse:  68  SpO2:  95%   Weight: 208 lb (94.3 kg)   Height: 5\' 10"  (1.778 m)      EXAM:  Gen: NAD HEENT: NCAT, sclera white Neck: No JVD Lungs: breath sounds mildly diminished with minimal scattered wheezes Cardiovascular: RRR, no murmurs Abdomen: Soft, nontender, normal BS Ext: without clubbing, cyanosis.  Trace symmetric ankle edema Neuro: grossly intact Skin: Limited exam, no lesions noted   DATA:   BMP Latest Ref Rng & Units 02/25/2018 04/03/2016 03/27/2016  Glucose 65 - 99 mg/dL - 96 148(H)  BUN 6 - 20 mg/dL - 16 15  Creatinine 0.61 - 1.24 mg/dL 1.00 1.07 0.97  Sodium 135 - 145 mmol/L - 138 133(L)  Potassium 3.5 - 5.1 mmol/L - 3.8 3.7  Chloride 101 - 111 mmol/L - 104 98(L)  CO2 22 - 32 mmol/L - 26 26  Calcium 8.9 - 10.3 mg/dL - 9.2 8.8(L)    CBC Latest Ref Rng & Units 04/03/2016 03/27/2016 03/23/2016  WBC 3.8 - 10.6 K/uL 12.8(H) 12.3(H) 11.2(H)  Hemoglobin 13.0 - 18.0 g/dL 9.1(L) 9.5(L) 8.7(L)  Hematocrit 40.0 - 52.0 % 27.1(L) 28.4(L) 25.5(L)  Platelets 150 - 440 K/uL 486(H) 301 188    CXR: No new film  I have personally reviewed all chest radiographs reported above including CXRs and CT chest unless otherwise indicated  IMPRESSION:     ICD-10-CM   1. Former smoker Z87.891 Pulse oximetry, overnight  2. COPD, moderate-severe J44.9 Pulse oximetry, overnight  3. Pulmonary hypertension suggested by echocardiogram 2017 I27.20 Pulse oximetry, overnight  4. DOE (dyspnea on exertion) R06.09   5. Multiple pulmonary nodules, benign appearance R91.8    It does  not appear that he has had a recent echocardiogram as he previously had indicated.  I discussed repeating this test for him to reassess possibility of pulmonary hypertension.  He wishes to forego any further testing of this nature at this time.  PFTs reveal a small reversible component to his COPD.  He benefits from Anoro inhaler but the benefit is short-lived and wears off in the afternoon most days.  Presence of wheezing and significant  reversibility suggests that he might benefit from an inhaled steroid  PLAN:  Sample of Trelegy inhaler provided He is to use the Trelegy to determine if he feels that this is more beneficial than Anoro If he believes the Trelegy has further benefit, he is to contact our office and we will change the prescription Overnight oximetry ordered Follow-up in 6 to 8 weeks   Merton Border, MD PCCM service Mobile [phone] Pager [phone] 04/10/2018 10:06 AM

## 2018-04-10 NOTE — Patient Instructions (Addendum)
Sample of Trelegy inhaler provided Use the Trelegy in place of Anoro and if you feel that it is an improvement, let us know and we will place a prescription for this Overnight oximetry ordered Follow up in 6-8 weeks

## 2018-04-21 ENCOUNTER — Telehealth: Payer: Self-pay | Admitting: Pulmonary Disease

## 2018-04-21 MED ORDER — FLUTICASONE-UMECLIDIN-VILANT 100-62.5-25 MCG/INH IN AEPB: 1.0000 | INHALATION_SPRAY | Freq: Every day | RESPIRATORY_TRACT | 2 refills | Status: DC

## 2018-04-21 NOTE — Telephone Encounter (Signed)
Spoke with wife and she states DS and pt can discuss doing an Echo when pt f/u. Trelegy sent as a 3 month supply. Nothing further needed.

## 2018-04-21 NOTE — Telephone Encounter (Signed)
Pt wife is calling to say the inhaler Trelegy works.   Pt states she found out that pt last echocardiogram was 01/09/2018 at [facility]. States Dr. Alva Garnet asked for this information.   *STAT* If patient is at the pharmacy, call can be transferred to refill team.   1. Which medications need to be refilled? (please list name of each medication and dose if known)  Trelegy  2. Which pharmacy/location (including street and city if local pharmacy) is medication to be sent to? [facility]  3. Do they need a 30 day or 90 day supply? Not sure

## 2018-04-23 ENCOUNTER — Encounter: Payer: Self-pay | Admitting: Pulmonary Disease

## 2018-04-23 DIAGNOSIS — Z87891 Personal history of nicotine dependence: Secondary | ICD-10-CM

## 2018-04-23 DIAGNOSIS — I272 Pulmonary hypertension, unspecified: Secondary | ICD-10-CM

## 2018-04-23 DIAGNOSIS — J449 Chronic obstructive pulmonary disease, unspecified: Secondary | ICD-10-CM

## 2018-05-05 ENCOUNTER — Telehealth: Payer: Self-pay | Admitting: *Deleted

## 2018-05-05 DIAGNOSIS — J449 Chronic obstructive pulmonary disease, unspecified: Secondary | ICD-10-CM

## 2018-05-05 NOTE — Telephone Encounter (Signed)
Called and gave results of ONO he does need 2 Liter of qhs.

## 2018-06-03 ENCOUNTER — Encounter: Payer: Self-pay | Admitting: Pulmonary Disease

## 2018-06-03 ENCOUNTER — Ambulatory Visit: Payer: Medicare Other | Admitting: Pulmonary Disease

## 2018-06-03 VITALS — BP 114/66 | HR 86 | Ht 70.0 in | Wt 208.0 lb

## 2018-06-03 DIAGNOSIS — J449 Chronic obstructive pulmonary disease, unspecified: Secondary | ICD-10-CM

## 2018-06-03 DIAGNOSIS — Z87891 Personal history of nicotine dependence: Secondary | ICD-10-CM

## 2018-06-03 DIAGNOSIS — I272 Pulmonary hypertension, unspecified: Secondary | ICD-10-CM

## 2018-06-03 DIAGNOSIS — G4734 Idiopathic sleep related nonobstructive alveolar hypoventilation: Secondary | ICD-10-CM

## 2018-06-03 DIAGNOSIS — R918 Other nonspecific abnormal finding of lung field: Secondary | ICD-10-CM | POA: Diagnosis not present

## 2018-06-03 DIAGNOSIS — R0609 Other forms of dyspnea: Secondary | ICD-10-CM | POA: Diagnosis not present

## 2018-06-03 MED ORDER — ALBUTEROL SULFATE HFA 108 (90 BASE) MCG/ACT IN AERS: 1.0000 | INHALATION_SPRAY | Freq: Four times a day (QID) | RESPIRATORY_TRACT | 10 refills | Status: DC | PRN

## 2018-06-03 NOTE — Patient Instructions (Signed)
Continue Trelegy inhaler each morning Continue oxygen therapy with sleep  New prescription: Albuterol inhaler, 1-2 inhalations up to every 6 hours as needed for increased shortness of breath, wheezing, chest tightness, cough  Follow-up in 4 to 6 months or sooner as needed

## 2018-06-03 NOTE — Progress Notes (Signed)
PULMONARY OFFICE FOLLOW-UP NOTE  Requesting MD/Service: Sabra Heck Date of initial consultation: 03/06/18 Reason for consultation: Multiple pulmonary nodular opacities  PT PROFILE: 84 y.o. male Former smoker (remote, quit 30 yrs ago) referred for evaluation of abnormalities noted on CT chest  DATA: 06/02/12 CT chest: (report only) Small pulmonary nodule noted in the right midlung field just posterior to the right major fissure. This is nonspecific and measures approximately 2 mm.Tiny nodular density right lung base  01/16/18 Myoview: Moderate fixed inferior myocardial perfusion defect consistent with possible previous infarct and/or scar or diaphragmatic attenuation without evidence of myocardial ischemia 02/25/18 CT chest: Mild diffuse bilateral bronchiectasis. Interval development of multiple clusters of nodules seen in the lingula, as well as LLL. The largest such cluster is in the posterior LLL with the largest nodule measuring 11 mm. New 6 mm subpleural nodule is noted in RLL. These nodules are most likely inflammatory in etiology 03/12/16 Echocardiogram: Normal LVEF, mild MR, mild elevation in estimated RVSP (41 mmHg) 04/03/18 PFTs: FVC: 2.62 > 2.61 L (62 %pred), FEV1: 1.45 > 1.63 L (45 > 51 %pred), FEV1/FVC: 55 > 63%, TLC: 8.25 L (121 %pred), FRC 6.33 L, 162 % pred, DLCO 49 %pred, DLCO/VA 76 %pred. Lung volume measurements might not be accurate due to leak  INTERVAL: Last seen 5/23.  No major events  SUBJ:  Scheduled reevaluation.  He has no new complaints.  He believes the Trelegy inhaler is beneficial.  However, he sometimes develops shortness of breath and chest tightness as the day wears on.  He has no rescue inhaler.  He denies chest pain, fever, cough, purulent sputum, hemoptysis.  He has trace chronic lower extremity edema without calf tenderness.   OBJ: Vitals:   06/03/18 0929 06/03/18 0933  BP:  114/66  Pulse:  86  SpO2:  99%  Weight: 208 lb (94.3 kg)   Height: 5\' 10"  (1.778  m)   Room air   EXAM:  Gen: NAD HEENT: NCAT, sclera white Neck: No JVD Lungs: breath sounds mildly diminished without wheezes Cardiovascular: RRR, no murmurs Abdomen: Soft, nontender, normal BS Ext: without clubbing, cyanosis.  Minimal symmetric pretibial edema Neuro: grossly intact Skin: Limited exam, no lesions noted    DATA:   BMP Latest Ref Rng & Units 02/25/2018 04/03/2016 03/27/2016  Glucose 65 - 99 mg/dL - 96 148(H)  BUN 6 - 20 mg/dL - 16 15  Creatinine 0.61 - 1.24 mg/dL 1.00 1.07 0.97  Sodium 135 - 145 mmol/L - 138 133(L)  Potassium 3.5 - 5.1 mmol/L - 3.8 3.7  Chloride 101 - 111 mmol/L - 104 98(L)  CO2 22 - 32 mmol/L - 26 26  Calcium 8.9 - 10.3 mg/dL - 9.2 8.8(L)    CBC Latest Ref Rng & Units 04/03/2016 03/27/2016 03/23/2016  WBC 3.8 - 10.6 K/uL 12.8(H) 12.3(H) 11.2(H)  Hemoglobin 13.0 - 18.0 g/dL 9.1(L) 9.5(L) 8.7(L)  Hematocrit 40.0 - 52.0 % 27.1(L) 28.4(L) 25.5(L)  Platelets 150 - 440 K/uL 486(H) 301 188    CXR: No new film  I have personally reviewed all chest radiographs reported above including CXRs and CT chest unless otherwise indicated  IMPRESSION:     ICD-10-CM   1. Former smoker Z87.891   2. COPD, moderate ([place]) J44.9   3. Pulmonary hypertension - he wishes to forego further evaluation I27.20   4. DOE (dyspnea on exertion) R06.09   5. Multiple pulmonary nodules - benign appearance on CT chest.  No further evaluation planned R91.8   6. Nocturnal  hypoxemia G47.34      PLAN:  Continue Trelegy inhaler each morning Continue oxygen therapy with sleep  New prescription: Albuterol inhaler, 1-2 inhalations up to every 6 hours as needed for increased shortness of breath, wheezing, chest tightness, cough  Follow-up in 4 to 6 months or sooner as needed   Merton Border, MD PCCM service Mobile [phone] Pager [phone] 06/03/2018 9:54 AM

## 2018-06-19 DIAGNOSIS — I2510 Atherosclerotic heart disease of native coronary artery without angina pectoris: Secondary | ICD-10-CM | POA: Insufficient documentation

## 2018-06-19 DIAGNOSIS — E119 Type 2 diabetes mellitus without complications: Secondary | ICD-10-CM | POA: Insufficient documentation

## 2018-06-19 DIAGNOSIS — J449 Chronic obstructive pulmonary disease, unspecified: Secondary | ICD-10-CM

## 2018-06-19 DIAGNOSIS — I272 Pulmonary hypertension, unspecified: Secondary | ICD-10-CM | POA: Insufficient documentation

## 2018-06-19 DIAGNOSIS — I25119 Atherosclerotic heart disease of native coronary artery with unspecified angina pectoris: Secondary | ICD-10-CM | POA: Insufficient documentation

## 2018-06-19 DIAGNOSIS — E1151 Type 2 diabetes mellitus with diabetic peripheral angiopathy without gangrene: Secondary | ICD-10-CM | POA: Insufficient documentation

## 2018-06-19 HISTORY — DX: Chronic obstructive pulmonary disease, unspecified: J44.9

## 2018-10-13 ENCOUNTER — Other Ambulatory Visit: Payer: Self-pay | Admitting: Pulmonary Disease

## 2018-11-04 ENCOUNTER — Ambulatory Visit (INDEPENDENT_AMBULATORY_CARE_PROVIDER_SITE_OTHER): Payer: Medicare Other | Admitting: Pulmonary Disease

## 2018-11-04 ENCOUNTER — Encounter: Payer: Self-pay | Admitting: Pulmonary Disease

## 2018-11-04 VITALS — BP 108/60 | HR 57 | Resp 16 | Ht 70.0 in | Wt 202.0 lb

## 2018-11-04 DIAGNOSIS — I4510 Unspecified right bundle-branch block: Secondary | ICD-10-CM | POA: Insufficient documentation

## 2018-11-04 DIAGNOSIS — I219 Acute myocardial infarction, unspecified: Secondary | ICD-10-CM | POA: Insufficient documentation

## 2018-11-04 DIAGNOSIS — Z87891 Personal history of nicotine dependence: Secondary | ICD-10-CM

## 2018-11-04 DIAGNOSIS — G4734 Idiopathic sleep related nonobstructive alveolar hypoventilation: Secondary | ICD-10-CM

## 2018-11-04 DIAGNOSIS — J449 Chronic obstructive pulmonary disease, unspecified: Secondary | ICD-10-CM

## 2018-11-04 DIAGNOSIS — I272 Pulmonary hypertension, unspecified: Secondary | ICD-10-CM | POA: Diagnosis not present

## 2018-11-04 NOTE — Patient Instructions (Signed)
Continue Trelegy inhaler daily, 1 inhalation.  Rinse mouth after use Continue albuterol inhaler as needed Continue oxygen therapy with sleep Follow-up in 6 months.  Call sooner if needed

## 2018-11-04 NOTE — Progress Notes (Signed)
PULMONARY OFFICE FOLLOW-UP NOTE  Requesting MD/Service: Sabra Heck Date of initial consultation: 03/06/18 Reason for consultation: Multiple pulmonary nodular opacities  PT PROFILE: 84 y.o. male Former smoker (remote, quit 30 yrs ago) referred for evaluation of abnormalities noted on CT chest  DATA: 06/02/12 CT chest: (report only) Small pulmonary nodule noted in the right midlung field just posterior to the right major fissure. This is nonspecific and measures approximately 2 mm.Tiny nodular density right lung base  01/16/18 Myoview: Moderate fixed inferior myocardial perfusion defect consistent with possible previous infarct and/or scar or diaphragmatic attenuation without evidence of myocardial ischemia 02/25/18 CT chest: Mild diffuse bilateral bronchiectasis. Interval development of multiple clusters of nodules seen in the lingula, as well as LLL. The largest such cluster is in the posterior LLL with the largest nodule measuring 11 mm. New 6 mm subpleural nodule is noted in RLL. These nodules are most likely inflammatory in etiology 03/12/16 Echocardiogram: Normal LVEF, mild MR, mild elevation in estimated RVSP (41 mmHg) 04/03/18 PFTs: FVC: 2.62 > 2.61 L (62 %pred), FEV1: 1.45 > 1.63 L (45 > 51 %pred), FEV1/FVC: 55 > 63%, TLC: 8.25 L (121 %pred), FRC 6.33 L, 162 % pred, DLCO 49 %pred, DLCO/VA 76 %pred. Lung volume measurements might not be accurate due to leak 04/23/18 ONO: ODI 10/hr. Lowest SpO2 82%. Time < 88%: 237 mins 04/29/18 Echocardiogram: LVEF 55%. Mild LVH.  Trivial mitral regurgitation.  LA size normal.  Normal RV systolic function.  Trivial tricuspid regurgitation  INTERVAL: Last seen 07/16.  No major events.  SUBJ:  This is a scheduled follow-up.  He has no new complaints.  He believes the Trelegy inhaler has been very beneficial and is extremely satisfied with his improvement in respiratory status and exercise tolerance.  He has used albuterol 2 times since last visit in July.  He  denies chest pain, fever, cough, purulent sputum, hemoptysis.  He has chronic lower extremity without recent change.   OBJ: Vitals:   11/04/18 1036 11/04/18 1050  BP:  108/60  Pulse:  (!) 57  Resp: 16   SpO2:  95%  Weight: 202 lb (91.6 kg)   Height: 5\' 10"  (1.778 m)   RA  EXAM:  Gen: NAD HEENT: NCAT, sclera white Neck: No JVD Lungs: breath sounds mildly diminished without wheezes or other adventitious sounds Cardiovascular: RRR, no murmurs Abdomen: Soft, nontender, normal BS Ext: without clubbing, cyanosis.  Trace symmetric ankle edema Neuro: grossly intact Skin: Limited exam, no lesions noted     DATA:   BMP Latest Ref Rng & Units 02/25/2018 04/03/2016 03/27/2016  Glucose 65 - 99 mg/dL - 96 148(H)  BUN 6 - 20 mg/dL - 16 15  Creatinine 0.61 - 1.24 mg/dL 1.00 1.07 0.97  Sodium 135 - 145 mmol/L - 138 133(L)  Potassium 3.5 - 5.1 mmol/L - 3.8 3.7  Chloride 101 - 111 mmol/L - 104 98(L)  CO2 22 - 32 mmol/L - 26 26  Calcium 8.9 - 10.3 mg/dL - 9.2 8.8(L)    CBC Latest Ref Rng & Units 04/03/2016 03/27/2016 03/23/2016  WBC 3.8 - 10.6 K/uL 12.8(H) 12.3(H) 11.2(H)  Hemoglobin 13.0 - 18.0 g/dL 9.1(L) 9.5(L) 8.7(L)  Hematocrit 40.0 - 52.0 % 27.1(L) 28.4(L) 25.5(L)  Platelets 150 - 440 K/uL 486(H) 301 188    CXR: No new film  I have personally reviewed all chest radiographs reported above including CXRs and CT chest unless otherwise indicated  IMPRESSION:     ICD-10-CM   1. Former smoker [phone]  2. COPD, moderate ([place]) J44.9   3. Pulmonary hypertension (HCC) I27.20   4. Nocturnal hypoxemia G47.34      PLAN:  Continue Trelegy inhaler daily, 1 inhalation.  Rinse mouth after use Continue albuterol inhaler as needed Continue oxygen therapy with sleep Follow-up in 6 months.  Call sooner if needed   Merton Border, MD PCCM service Mobile [phone] Pager [phone] 11/05/2018 8:55 AM

## 2019-04-08 ENCOUNTER — Other Ambulatory Visit: Payer: Self-pay | Admitting: Pulmonary Disease

## 2019-05-05 ENCOUNTER — Other Ambulatory Visit: Payer: Self-pay

## 2019-05-05 ENCOUNTER — Ambulatory Visit: Payer: Medicare Other | Admitting: Pulmonary Disease

## 2019-05-05 ENCOUNTER — Encounter: Payer: Self-pay | Admitting: Pulmonary Disease

## 2019-05-05 VITALS — BP 148/72 | HR 56 | Temp 97.1°F | Ht 72.0 in | Wt 205.2 lb

## 2019-05-05 DIAGNOSIS — G4734 Idiopathic sleep related nonobstructive alveolar hypoventilation: Secondary | ICD-10-CM

## 2019-05-05 DIAGNOSIS — Z87891 Personal history of nicotine dependence: Secondary | ICD-10-CM

## 2019-05-05 DIAGNOSIS — I272 Pulmonary hypertension, unspecified: Secondary | ICD-10-CM

## 2019-05-05 DIAGNOSIS — J449 Chronic obstructive pulmonary disease, unspecified: Secondary | ICD-10-CM

## 2019-05-05 NOTE — Progress Notes (Signed)
PULMONARY OFFICE FOLLOW-UP NOTE  Requesting MD/Service: Sabra Heck Date of initial consultation: 03/06/18 Reason for consultation: Multiple pulmonary nodular opacities  PT PROFILE: 85 y.o. male Former smoker (remote, quit 30 yrs ago) referred for evaluation of abnormalities noted on CT chest  DATA: 06/02/12 CT chest: (report only) Small pulmonary nodule noted in the right midlung field just posterior to the right major fissure. This is nonspecific and measures approximately 2 mm.Tiny nodular density right lung base  01/16/18 Myoview: Moderate fixed inferior myocardial perfusion defect consistent with possible previous infarct and/or scar or diaphragmatic attenuation without evidence of myocardial ischemia 02/25/18 CT chest: Mild diffuse bilateral bronchiectasis. Interval development of multiple clusters of nodules seen in the lingula, as well as LLL. The largest such cluster is in the posterior LLL with the largest nodule measuring 11 mm. New 6 mm subpleural nodule is noted in RLL. These nodules are most likely inflammatory in etiology 03/12/16 Echocardiogram: Normal LVEF, mild MR, mild elevation in estimated RVSP (41 mmHg) 04/03/18 PFTs: FVC: 2.62 > 2.61 L (62 %pred), FEV1: 1.45 > 1.63 L (45 > 51 %pred), FEV1/FVC: 55 > 63%, TLC: 8.25 L (121 %pred), FRC 6.33 L, 162 % pred, DLCO 49 %pred, DLCO/VA 76 %pred. Lung volume measurements might not be accurate due to leak 04/23/18 ONO: ODI 10/hr. Lowest SpO2 82%. Time < 88%: 237 mins 04/29/18 Echocardiogram: LVEF 55%. Mild LVH.  Trivial mitral regurgitation.  LA size normal.  Normal RV systolic function.  Trivial tricuspid regurgitation  INTERVAL: Last seen 11/04/18.  No major pulmonary or medical events.  SUBJ:  This is a scheduled follow-up.  He has no new complaints.  He remains on Trelegy inhaler daily.  He knows to rinse his mouth after use.  He has not used albuterol inhaler since last visit.  He rarely feels limited by shortness of breath.   Presently, back pain is the most limiting factor to his exertional tolerance.  He denies CP, fever, purulent sputum, hemoptysis and calf tenderness.  He has chronic mild LE edema   OBJ: Vitals:   05/05/19 1022 05/05/19 1025  BP:  (!) 148/72  Pulse:  (!) 56  Temp:  (!) 97.1 F (36.2 C)  TempSrc:  Temporal  SpO2:  97%  Weight: 205 lb 3.2 oz (93.1 kg)   Height: 6' (1.829 m)   RA  EXAM:  Gen: NAD HEENT: NCAT, sclerae white Neck: No JVD Lungs: breath sounds mildly diminished, no wheezes or other adventitious sounds Cardiovascular: RRR, no murmurs Abdomen: Soft, nontender, normal BS Ext: without clubbing, cyanosis.  Trace symmetric LE edema Neuro: grossly intact Skin: Limited exam, no lesions noted    DATA:   BMP Latest Ref Rng & Units 02/25/2018 04/03/2016 03/27/2016  Glucose 65 - 99 mg/dL - 96 148(H)  BUN 6 - 20 mg/dL - 16 15  Creatinine 0.61 - 1.24 mg/dL 1.00 1.07 0.97  Sodium 135 - 145 mmol/L - 138 133(L)  Potassium 3.5 - 5.1 mmol/L - 3.8 3.7  Chloride 101 - 111 mmol/L - 104 98(L)  CO2 22 - 32 mmol/L - 26 26  Calcium 8.9 - 10.3 mg/dL - 9.2 8.8(L)    CBC Latest Ref Rng & Units 04/03/2016 03/27/2016 03/23/2016  WBC 3.8 - 10.6 K/uL 12.8(H) 12.3(H) 11.2(H)  Hemoglobin 13.0 - 18.0 g/dL 9.1(L) 9.5(L) 8.7(L)  Hematocrit 40.0 - 52.0 % 27.1(L) 28.4(L) 25.5(L)  Platelets 150 - 440 K/uL 486(H) 301 188    CXR: No new film  I have personally reviewed all chest radiographs  reported above including CXRs and CT chest unless otherwise indicated  IMPRESSION:     ICD-10-CM   1. Moderate COPD (chronic obstructive pulmonary disease) (HCC)  J44.9   2. Former smoker  Z87.891   3. Mild pulmonary hypertension (HCC)  I27.20   4. Nocturnal hypoxemia  G47.34    Pulmonary hypertension is mild.  Previously he chose to forego further evaluation  PLAN:  Continue Trelegy inhaler daily, 1 inhalation.  Rinse mouth after use Continue albuterol inhaler as needed Continue oxygen therapy with  sleep Follow-up in this office as needed for any breathing, pulmonary, chest problems Henceforth, his inhaler medications can be refilled by his primary care physician.   Merton Border, MD PCCM service Mobile [phone] Pager [phone] 05/05/2019 10:39 AM

## 2019-05-05 NOTE — Patient Instructions (Signed)
Continue Trelegy inhaler Continue oxygen with sleep Continue albuterol as needed for increased shortness of breath, wheezing, chest tightness, cough Follow-up in this office as needed for any breathing, chest or respiratory problems

## 2021-05-10 ENCOUNTER — Emergency Department: Payer: Medicare PPO

## 2021-05-10 ENCOUNTER — Observation Stay
Admission: EM | Admit: 2021-05-10 | Discharge: 2021-05-12 | Disposition: A | Discharge status: Home / Self Care | Payer: Medicare PPO | Attending: Internal Medicine | Admitting: Internal Medicine

## 2021-05-10 ENCOUNTER — Encounter: Payer: Self-pay | Admitting: Emergency Medicine

## 2021-05-10 DIAGNOSIS — E119 Type 2 diabetes mellitus without complications: Secondary | ICD-10-CM | POA: Diagnosis not present

## 2021-05-10 DIAGNOSIS — Z87891 Personal history of nicotine dependence: Secondary | ICD-10-CM | POA: Diagnosis not present

## 2021-05-10 DIAGNOSIS — J9621 Acute and chronic respiratory failure with hypoxia: Secondary | ICD-10-CM | POA: Insufficient documentation

## 2021-05-10 DIAGNOSIS — I2510 Atherosclerotic heart disease of native coronary artery without angina pectoris: Secondary | ICD-10-CM | POA: Diagnosis not present

## 2021-05-10 DIAGNOSIS — Z20822 Contact with and (suspected) exposure to covid-19: Secondary | ICD-10-CM | POA: Insufficient documentation

## 2021-05-10 DIAGNOSIS — R0602 Shortness of breath: Secondary | ICD-10-CM | POA: Diagnosis present

## 2021-05-10 DIAGNOSIS — Z79899 Other long term (current) drug therapy: Secondary | ICD-10-CM | POA: Insufficient documentation

## 2021-05-10 DIAGNOSIS — J449 Chronic obstructive pulmonary disease, unspecified: Secondary | ICD-10-CM | POA: Diagnosis present

## 2021-05-10 DIAGNOSIS — Z85828 Personal history of other malignant neoplasm of skin: Secondary | ICD-10-CM | POA: Insufficient documentation

## 2021-05-10 DIAGNOSIS — I10 Essential (primary) hypertension: Secondary | ICD-10-CM | POA: Diagnosis not present

## 2021-05-10 DIAGNOSIS — J441 Chronic obstructive pulmonary disease with (acute) exacerbation: Secondary | ICD-10-CM | POA: Diagnosis not present

## 2021-05-10 DIAGNOSIS — R059 Cough, unspecified: Secondary | ICD-10-CM

## 2021-05-10 DIAGNOSIS — J9611 Chronic respiratory failure with hypoxia: Secondary | ICD-10-CM

## 2021-05-10 DIAGNOSIS — Z886 Allergy status to analgesic agent status: Secondary | ICD-10-CM | POA: Diagnosis not present

## 2021-05-10 DIAGNOSIS — I252 Old myocardial infarction: Secondary | ICD-10-CM | POA: Insufficient documentation

## 2021-05-10 DIAGNOSIS — E876 Hypokalemia: Secondary | ICD-10-CM | POA: Diagnosis not present

## 2021-05-10 DIAGNOSIS — R7301 Impaired fasting glucose: Secondary | ICD-10-CM

## 2021-05-10 DIAGNOSIS — Z96641 Presence of right artificial hip joint: Secondary | ICD-10-CM | POA: Diagnosis not present

## 2021-05-10 DIAGNOSIS — Z7982 Long term (current) use of aspirin: Secondary | ICD-10-CM | POA: Insufficient documentation

## 2021-05-10 DIAGNOSIS — J9601 Acute respiratory failure with hypoxia: Secondary | ICD-10-CM

## 2021-05-10 MED ORDER — METHYLPREDNISOLONE 4 MG PO TBPK: ORAL_TABLET | ORAL | 0 refills | Status: DC

## 2021-05-10 MED ORDER — DOXYCYCLINE HYCLATE 50 MG PO CAPS: 100.0000 mg | ORAL_CAPSULE | Freq: Two times a day (BID) | ORAL | 0 refills | Status: AC

## 2021-05-10 MED ORDER — DOXYCYCLINE HYCLATE 100 MG PO TABS
100.0000 mg | ORAL_TABLET | Freq: Once | ORAL | Status: AC
  Administered 2021-05-10: 100 mg via ORAL
  Filled 2021-05-10: qty 1

## 2021-05-10 NOTE — ED Triage Notes (Signed)
Pt presents with sob x 4 days , productive cough ( thick greenish) x 2 days . New onset of fever today . Pt does have COPD.   No acute distress noted at this time vitals appear  WNL

## 2021-05-10 NOTE — ED Provider Notes (Signed)
-----------------------------------------   11:10 PM on 05/10/2021 -----------------------------------------  Assuming care from Dr. Cheri Fowler.  In short, Derek Haney is a 87 y.o. male with a chief complaint of probable COPD exacerbation.  Refer to the original H&P for additional details.  The current plan of care is to reassess after reviewing labs.  Anticipate discharge.   ----------------------------------------- 1:31 AM on 05/11/2021 -----------------------------------------  The patient is feeling comfortable from a respiratory standpoint.  However he has multiple critical labs including a potassium of 2.2 and a calcium of 5.1, with no prior history of either.  Although depending on the timing of his albuterol, his potassium level could be artificially low, it should not be this low.  I reviewed the triage EKG again and did not see any critical abnormalities on the EKG and the patient is essentially asymptomatic at this time, but given his profound electrolyte abnormalities with no clear cause I will admit him to the hospital.  I discussed this with the patient and he understands and agree.  Treatment: Calcium gluconate 1 g IV, potassium 40 mill equivalents by mouth, potassium 10 mill equivalents IV over 1 hour x 6 doses.  Magnesium and phosphorus levels have been added on and are pending.  I called and discussed the case by phone with Dr. Damita Dunnings with the hospitalist service and she will admit.   Hinda Kehr, MD 05/11/21 [phone]

## 2021-05-10 NOTE — ED Provider Notes (Signed)
[facility] Emergency Department Provider Note   ____________________________________________   Event Date/Time   First MD Initiated Contact with Patient 05/10/21 2300     (approximate)  I have reviewed the triage vital signs and the nursing notes.   HISTORY  Chief Complaint Cough and Shortness of Breath    HPI Derek Haney is a 87 y.o. male with the below stated past medical history including COPD who presents for worsening cough and shortness of breath over the last 4 days.  Patient states it is worsened with exertion and partially relieves at rest.  Patient does endorse a productive cough of green sputum and subjective fevers.  Patient currently denies any vision changes, tinnitus, difficulty speaking, facial droop, sore throat, chest pain, abdominal pain, nausea/vomiting/diarrhea, dysuria, or weakness/numbness/paresthesias in any extremity         Past Medical History:  Diagnosis Date   Anemia    Cancer ([place])    basal cell   CHF (congestive heart failure) (HCC)    Coronary artery disease    Dysrhythmia    GERD (gastroesophageal reflux disease)    Glaucoma (increased eye pressure)    Hypertension    Moderate COPD (chronic obstructive pulmonary disease) ([place]) 06/19/2018   Myocardial infarction [facility])     Patient Active Problem List   Diagnosis Date Noted   MI (myocardial infarction) ([place]) 11/04/2018   Right bundle branch block 11/04/2018   Atherosclerosis of native coronary artery of native heart with angina pectoris ([place]) 06/19/2018   Moderate COPD (chronic obstructive pulmonary disease) ([place]) 06/19/2018   Pulmonary hypertension, moderate to severe ([place]) 06/19/2018   Type 2 diabetes mellitus with peripheral angiopathy ([place]) 06/19/2018   Bradycardia 01/16/2018   Gastroesophageal reflux disease without esophagitis 07/16/2016   Mixed hyperlipidemia 07/16/2016   S/P total hip arthroplasty 03/21/2016   Benign essential hypertension  04/15/2015   Glaucoma 06/10/2012    Past Surgical History:  Procedure Laterality Date   CORONARY ANGIOPLASTY     TONSILLECTOMY     TOTAL HIP ARTHROPLASTY Right 03/21/2016   Procedure: TOTAL HIP ARTHROPLASTY;  Surgeon: [name], MD;  Location: [facility] ORS;  Service: Orthopedics;  Laterality: Right;    Prior to Admission medications   Medication Sig Start Date End Date Taking? Authorizing Provider  doxycycline (VIBRAMYCIN) 50 MG capsule Take 2 capsules (100 mg total) by mouth 2 (two) times daily for 5 days. 05/10/21 05/15/21 Yes Naaman Plummer, MD  methylPREDNISolone (MEDROL DOSEPAK) 4 MG TBPK tablet Follow instructions in package 05/10/21  Yes Manuel Lawhead, Vista Lawman, MD  acetaminophen (ACETAMINOPHEN 8 HOUR) 650 MG CR tablet Take 1,300 mg by mouth every 8 (eight) hours as needed for pain.    [provider]  albuterol (PROVENTIL HFA;VENTOLIN HFA) 108 (90 Base) MCG/ACT inhaler Inhale 1-2 puffs into the lungs every 6 (six) hours as needed for wheezing or shortness of breath. 06/03/18   Wilhelmina Mcardle, MD  atorvastatin (LIPITOR) 20 MG tablet Take 20 mg by mouth daily at 6 PM.    [provider]  bimatoprost (LUMIGAN) 0.01 % SOLN Place 1 drop into both eyes at bedtime.    [provider]  cycloSPORINE (RESTASIS) 0.05 % ophthalmic emulsion Place 1 drop into both eyes 2 (two) times daily.    [provider]  dorzolamide (TRUSOPT) 2 % ophthalmic solution Place 1 drop into both eyes 2 (two) times daily.    [provider]  furosemide (LASIX) 20 MG tablet Take 20 mg by mouth  daily.    [provider]  Glucos-Chondroit-Hyaluron-MSM (GLUCOSAMINE CHONDROITIN JOINT) TABS Take 2 tablets by mouth every morning.    [provider]  ISOSORBIDE MONONITRATE ER PO Take 30 mg by mouth 3 (three) times daily.    [provider]  pantoprazole (PROTONIX) 40 MG tablet Take 40 mg by mouth daily.    [provider]  potassium chloride SA  (K-DUR,KLOR-CON) 20 MEQ tablet Take 20 mEq by mouth daily.    [provider]  propranolol (INDERAL) 20 MG tablet Take 20 mg by mouth 4 (four) times daily.    [provider]  traMADol (ULTRAM) 50 MG tablet Take 1-2 tablets (50-100 mg total) by mouth every 4 (four) hours as needed for moderate pain. 03/22/16   Watt Climes, PA  TRELEGY ELLIPTA 100-62.5-25 MCG/INH AEPB INHALE 1 PUFFS BY MOUTH ONCE DAILY 04/08/19   Wilhelmina Mcardle, MD    Allergies Aspirin and Penicillins  History reviewed. No pertinent family history.  Social History Social History   Tobacco Use   Smoking status: Former    Packs/day: 1.00    Years: 15.00    Pack years: 15.00    Types: Cigarettes   Smokeless tobacco: Former    Types: Chew   Tobacco comments:    it's been over 20 since he quit  Substance Use Topics   Alcohol use: Yes    Comment: occ   Drug use: No    Review of Systems Constitutional: No fever/chills Eyes: No visual changes. ENT: Productive cough.  No sore throat. Cardiovascular: Denies chest pain. Respiratory: Endorses shortness of breath. Gastrointestinal: No abdominal pain.  No nausea, no vomiting.  No diarrhea. Genitourinary: Negative for dysuria. Musculoskeletal: Negative for acute arthralgias Skin: Negative for rash. Neurological: Negative for headaches, weakness/numbness/paresthesias in any extremity Psychiatric: Negative for suicidal ideation/homicidal ideation   ____________________________________________   PHYSICAL EXAM:  VITAL SIGNS: ED Triage Vitals  Enc Vitals Group     BP 05/10/21 2057 126/62     Pulse Rate 05/10/21 2057 75     Resp 05/10/21 2057 20     Temp --      Temp src --      SpO2 05/10/21 2057 95 %     Weight 05/10/21 2059 205 lb 0.4 oz (93 kg)     Height 05/10/21 2059 5\' 10"  (1.778 m)     Head Circumference --      Peak Flow --      Pain Score 05/10/21 2059 0     Pain Loc --      Pain Edu? --      Excl. in [place]? --    Constitutional:  Alert and oriented. Well appearing and in no acute distress. Eyes: Conjunctivae are normal. PERRL. Head: Atraumatic. Nose: No congestion/rhinnorhea. Mouth/Throat: Mucous membranes are moist. Neck: No stridor Cardiovascular: Grossly normal heart sounds.  Good peripheral circulation. Respiratory: Rhonchi over bilateral lung fields that clears with coughing.  Normal respiratory effort.  No retractions. Gastrointestinal: Soft and nontender. No distention. Musculoskeletal: No obvious deformities Neurologic:  Normal speech and language. No gross focal neurologic deficits are appreciated. Skin:  Skin is warm and dry. No rash noted. Psychiatric: Mood and affect are normal. Speech and behavior are normal.  ____________________________________________   LABS (all labs ordered are listed, but only abnormal results are displayed)  Labs Reviewed  COMPREHENSIVE METABOLIC PANEL  BRAIN NATRIURETIC PEPTIDE  CBC WITH DIFFERENTIAL/PLATELET  TROPONIN I (HIGH SENSITIVITY)  TROPONIN I (HIGH SENSITIVITY)  ____________________________________________  EKG  ED ECG REPORT I, Naaman Plummer, the attending physician, personally viewed and interpreted this ECG.  Date: 05/10/2021 EKG Time: 2058 Rate: 75 Rhythm: normal sinus rhythm QRS Axis: normal Intervals: normal ST/T Wave abnormalities: normal Narrative Interpretation: no evidence of acute ischemia  ____________________________________________  RADIOLOGY  ED MD interpretation: 2 view chest x-ray shows no evidence of acute abnormalities including no pneumonia, pneumothorax, or widened mediastinum  Official radiology report(s): DG Chest 2 View  Result Date: 05/10/2021 CLINICAL DATA:  Productive cough and shortness of breath EXAM: CHEST - 2 VIEW COMPARISON:  07/28/2018 FINDINGS: Cardiac shadow is within normal limits. Aortic calcifications are noted. Lungs are well aerated bilaterally with chronic interstitial markings stable from prior CT  examination. Likely calcified granuloma is noted anterior to the spine on the lateral film but not well visualized on the frontal film. No focal infiltrate or effusion is seen. Degenerative changes of the thoracic spine are noted. IMPRESSION: No acute abnormality noted. Likely calcified granuloma anterior to the spine on the lateral film only. Electronically Signed   By: Inez Catalina M.D.   On: 05/10/2021 21:52    ____________________________________________   PROCEDURES  Procedure(s) performed (including Critical Care):  Procedures   ____________________________________________   INITIAL IMPRESSION / ASSESSMENT AND PLAN / ED COURSE  As part of my medical decision making, I reviewed the following data within the [facility] notes reviewed and incorporated, Labs reviewed, EKG interpreted, Old chart reviewed, Radiograph reviewed and Notes from prior ED visits reviewed and incorporated        The patient appears to be suffering from a moderate exacerbation of COPD.  Based on the history, exam, CXR/EKG, and further workup I dont suspect any other emergent cause of this presentation, such as pneumonia, acute coronary syndrome, congestive heart failure, pulmonary embolism, or pneumothorax.  ED Interventions: bronchodilators, steroids, antibiotics, reassess  2340 Reassessment: After treatment, the patients shortness of breath is resolved, and their lung exam has returned to baseline. They are comfortable and want to go home.  Rx: Steroids, Antibiotics, Albuterol Disposition: Discharge home with SRP. PCP follow up recommended in next 48hours.      ____________________________________________   FINAL CLINICAL IMPRESSION(S) / ED DIAGNOSES  Final diagnoses:  COPD exacerbation ([place])  Shortness of breath  Cough     ED Discharge Orders          Ordered    doxycycline (VIBRAMYCIN) 50 MG capsule  2 times daily        05/10/21 2341    methylPREDNISolone  (MEDROL DOSEPAK) 4 MG TBPK tablet        05/10/21 2341             Note:  This document was prepared using Dragon voice recognition software and may include unintentional dictation errors.    Naaman Plummer, MD 05/10/21 [id number]

## 2021-05-11 ENCOUNTER — Other Ambulatory Visit: Payer: Self-pay

## 2021-05-11 ENCOUNTER — Observation Stay: Payer: Medicare PPO

## 2021-05-11 DIAGNOSIS — R7309 Other abnormal glucose: Secondary | ICD-10-CM

## 2021-05-11 DIAGNOSIS — J441 Chronic obstructive pulmonary disease with (acute) exacerbation: Secondary | ICD-10-CM

## 2021-05-11 DIAGNOSIS — I5032 Chronic diastolic (congestive) heart failure: Secondary | ICD-10-CM | POA: Insufficient documentation

## 2021-05-11 DIAGNOSIS — R531 Weakness: Secondary | ICD-10-CM | POA: Insufficient documentation

## 2021-05-11 DIAGNOSIS — I2510 Atherosclerotic heart disease of native coronary artery without angina pectoris: Secondary | ICD-10-CM

## 2021-05-11 DIAGNOSIS — E876 Hypokalemia: Secondary | ICD-10-CM

## 2021-05-11 DIAGNOSIS — J9611 Chronic respiratory failure with hypoxia: Secondary | ICD-10-CM

## 2021-05-11 LAB — GLUCOSE, CAPILLARY: Glucose-Capillary: 131 mg/dL — ABNORMAL HIGH (ref 70–99)

## 2021-05-11 LAB — COMPREHENSIVE METABOLIC PANEL
ALT: 12 U/L (ref 0–44)
AST: 16 U/L (ref 15–41)
Albumin: 1.8 g/dL — ABNORMAL LOW (ref 3.5–5.0)
Alkaline Phosphatase: 39 U/L (ref 38–126)
Anion gap: 3 — ABNORMAL LOW (ref 5–15)
BUN: 10 mg/dL (ref 8–23)
CO2: 14 mmol/L — ABNORMAL LOW (ref 22–32)
Calcium: 5.1 mg/dL — CL (ref 8.9–10.3)
Chloride: 123 mmol/L — ABNORMAL HIGH (ref 98–111)
Creatinine, Ser: 0.45 mg/dL — ABNORMAL LOW (ref 0.61–1.24)
GFR, Estimated: >60 mL/min (ref >60)
Glucose, Bld: 105 mg/dL — ABNORMAL HIGH (ref 70–99)
Potassium: 2.2 mmol/L — CL (ref 3.5–5.1)
Sodium: 140 mmol/L (ref 135–145)
Total Bilirubin: 0.6 mg/dL (ref 0.3–1.2)
Total Protein: 4.2 g/dL — ABNORMAL LOW (ref 6.5–8.1)

## 2021-05-11 LAB — CBC WITH DIFFERENTIAL/PLATELET
Abs Immature Granulocytes: 0.08 10*3/uL — ABNORMAL HIGH (ref 0.00–0.07)
Basophils Absolute: 0.1 10*3/uL (ref 0.0–0.1)
Basophils Relative: 1 %
Eosinophils Absolute: 0 10*3/uL (ref 0.0–0.5)
Eosinophils Relative: 0 %
HCT: 32.9 % — ABNORMAL LOW (ref 39.0–52.0)
Hemoglobin: 11.1 g/dL — ABNORMAL LOW (ref 13.0–17.0)
Immature Granulocytes: 1 %
Lymphocytes Relative: 8 %
Lymphs Abs: 0.8 10*3/uL (ref 0.7–4.0)
MCH: 31.7 pg (ref 26.0–34.0)
MCHC: 33.7 g/dL (ref 30.0–36.0)
MCV: 94 fL (ref 80.0–100.0)
Monocytes Absolute: 1.1 10*3/uL — ABNORMAL HIGH (ref 0.1–1.0)
Monocytes Relative: 10 %
Neutro Abs: 8.9 10*3/uL — ABNORMAL HIGH (ref 1.7–7.7)
Neutrophils Relative %: 80 %
Platelets: 242 10*3/uL (ref 150–400)
RBC: 3.5 MIL/uL — ABNORMAL LOW (ref 4.22–5.81)
RDW: 13 % (ref 11.5–15.5)
WBC: 11.1 10*3/uL — ABNORMAL HIGH (ref 4.0–10.5)
nRBC: 0 % (ref 0.0–0.2)

## 2021-05-11 LAB — HEMOGLOBIN A1C
Hgb A1c MFr Bld: 6.2 % — ABNORMAL HIGH (ref 4.8–5.6)
Mean Plasma Glucose: 131.24 mg/dL

## 2021-05-11 LAB — PHOSPHORUS: Phosphorus: 3.2 mg/dL (ref 2.5–4.6)

## 2021-05-11 LAB — BASIC METABOLIC PANEL
Anion gap: 7 (ref 5–15)
BUN: 18 mg/dL (ref 8–23)
CO2: 25 mmol/L (ref 22–32)
Calcium: 9.2 mg/dL (ref 8.9–10.3)
Chloride: 106 mmol/L (ref 98–111)
Creatinine, Ser: 0.83 mg/dL (ref 0.61–1.24)
GFR, Estimated: >60 mL/min (ref >60)
Glucose, Bld: 190 mg/dL — ABNORMAL HIGH (ref 70–99)
Potassium: 5.1 mmol/L (ref 3.5–5.1)
Sodium: 138 mmol/L (ref 135–145)

## 2021-05-11 LAB — CBG MONITORING, ED
Glucose-Capillary: 185 mg/dL — ABNORMAL HIGH (ref 70–99)
Glucose-Capillary: 174 mg/dL — ABNORMAL HIGH (ref 70–99)
Glucose-Capillary: 200 mg/dL — ABNORMAL HIGH (ref 70–99)
Glucose-Capillary: 166 mg/dL — ABNORMAL HIGH (ref 70–99)

## 2021-05-11 LAB — RESP PANEL BY RT-PCR (FLU A&B, COVID) ARPGX2
Influenza A by PCR: NEGATIVE
Influenza B by PCR: NEGATIVE
SARS Coronavirus 2 by RT PCR: NEGATIVE

## 2021-05-11 LAB — MAGNESIUM: Magnesium: 2.2 mg/dL (ref 1.7–2.4)

## 2021-05-11 LAB — CBC
HCT: 32.8 % — ABNORMAL LOW (ref 39.0–52.0)
Hemoglobin: 11 g/dL — ABNORMAL LOW (ref 13.0–17.0)
MCH: 31.8 pg (ref 26.0–34.0)
MCHC: 33.5 g/dL (ref 30.0–36.0)
MCV: 94.8 fL (ref 80.0–100.0)
Platelets: 231 10*3/uL (ref 150–400)
RBC: 3.46 MIL/uL — ABNORMAL LOW (ref 4.22–5.81)
RDW: 13 % (ref 11.5–15.5)
WBC: 8.8 10*3/uL (ref 4.0–10.5)
nRBC: 0 % (ref 0.0–0.2)

## 2021-05-11 LAB — TROPONIN I (HIGH SENSITIVITY)
Troponin I (High Sensitivity): 6 ng/L (ref <18)
Troponin I (High Sensitivity): 7 ng/L (ref <18)

## 2021-05-11 LAB — VITAMIN D 25 HYDROXY (VIT D DEFICIENCY, FRACTURES): Vit D, 25-Hydroxy: 44.08 ng/mL (ref 30–100)

## 2021-05-11 LAB — MRSA NEXT GEN BY PCR, NASAL: MRSA by PCR Next Gen: NOT DETECTED

## 2021-05-11 LAB — TSH: TSH: 0.523 u[IU]/mL (ref 0.350–4.500)

## 2021-05-11 LAB — PSA: Prostatic Specific Antigen: <0.01 ng/mL (ref 0.00–4.00)

## 2021-05-11 LAB — BRAIN NATRIURETIC PEPTIDE: B Natriuretic Peptide: 105.5 pg/mL — ABNORMAL HIGH (ref 0.0–100.0)

## 2021-05-11 MED ORDER — FUROSEMIDE 20 MG PO TABS
20.0000 mg | ORAL_TABLET | Freq: Every day | ORAL | Status: DC
  Administered 2021-05-11 – 2021-05-12 (×2): 20 mg via ORAL
  Filled 2021-05-11: qty 1

## 2021-05-11 MED ORDER — BENZONATATE 100 MG PO CAPS
100.0000 mg | ORAL_CAPSULE | Freq: Three times a day (TID) | ORAL | Status: DC
  Administered 2021-05-11 – 2021-05-12 (×3): 100 mg via ORAL
  Filled 2021-05-11 (×3): qty 1

## 2021-05-11 MED ORDER — ATORVASTATIN CALCIUM 20 MG PO TABS
20.0000 mg | ORAL_TABLET | Freq: Every day | ORAL | Status: DC
  Administered 2021-05-11: 20 mg via ORAL
  Filled 2021-05-11: qty 1

## 2021-05-11 MED ORDER — PANTOPRAZOLE SODIUM 40 MG PO TBEC
40.0000 mg | DELAYED_RELEASE_TABLET | Freq: Every day | ORAL | Status: DC
  Administered 2021-05-11 – 2021-05-12 (×2): 40 mg via ORAL
  Filled 2021-05-11 (×2): qty 1

## 2021-05-11 MED ORDER — LATANOPROST 0.005 % OP SOLN
1.0000 [drp] | Freq: Every day | OPHTHALMIC | Status: DC
  Administered 2021-05-11: 1 [drp] via OPHTHALMIC
  Filled 2021-05-11: qty 2.5

## 2021-05-11 MED ORDER — ONDANSETRON HCL 4 MG/2ML IJ SOLN: 4.0000 mg | Freq: Four times a day (QID) | INTRAMUSCULAR | Status: DC | PRN

## 2021-05-11 MED ORDER — HYDROCOD POLST-CPM POLST ER 10-8 MG/5ML PO SUER
5.0000 mL | Freq: Two times a day (BID) | ORAL | Status: DC | PRN
  Administered 2021-05-11: 5 mL via ORAL
  Filled 2021-05-11: qty 5

## 2021-05-11 MED ORDER — ISOSORBIDE MONONITRATE ER 30 MG PO TB24
30.0000 mg | ORAL_TABLET | Freq: Three times a day (TID) | ORAL | Status: DC
  Administered 2021-05-11 – 2021-05-12 (×4): 30 mg via ORAL
  Filled 2021-05-11 (×4): qty 1

## 2021-05-11 MED ORDER — POTASSIUM CHLORIDE 10 MEQ/100ML IV SOLN
10.0000 meq | INTRAVENOUS | Status: AC
  Administered 2021-05-11 (×4): 10 meq via INTRAVENOUS
  Filled 2021-05-11 (×5): qty 100

## 2021-05-11 MED ORDER — UMECLIDINIUM BROMIDE 62.5 MCG/INH IN AEPB
1.0000 | INHALATION_SPRAY | Freq: Every day | RESPIRATORY_TRACT | Status: DC
  Filled 2021-05-11: qty 7

## 2021-05-11 MED ORDER — INSULIN ASPART 100 UNIT/ML IJ SOLN
0.0000 [IU] | Freq: Three times a day (TID) | INTRAMUSCULAR | Status: DC
  Administered 2021-05-11 (×3): 3 [IU] via SUBCUTANEOUS
  Filled 2021-05-11 (×3): qty 1

## 2021-05-11 MED ORDER — POTASSIUM CHLORIDE CRYS ER 20 MEQ PO TBCR
40.0000 meq | EXTENDED_RELEASE_TABLET | Freq: Once | ORAL | Status: AC
  Administered 2021-05-11: 40 meq via ORAL
  Filled 2021-05-11: qty 2

## 2021-05-11 MED ORDER — IPRATROPIUM-ALBUTEROL 0.5-2.5 (3) MG/3ML IN SOLN: 3.0000 mL | Freq: Four times a day (QID) | RESPIRATORY_TRACT | Status: DC

## 2021-05-11 MED ORDER — DORZOLAMIDE HCL 2 % OP SOLN
1.0000 [drp] | Freq: Two times a day (BID) | OPHTHALMIC | Status: DC
  Administered 2021-05-11 – 2021-05-12 (×3): 1 [drp] via OPHTHALMIC
  Filled 2021-05-11: qty 10

## 2021-05-11 MED ORDER — PROPRANOLOL HCL 20 MG PO TABS
20.0000 mg | ORAL_TABLET | Freq: Four times a day (QID) | ORAL | Status: DC
  Administered 2021-05-11 (×3): 20 mg via ORAL
  Filled 2021-05-11 (×3): qty 1

## 2021-05-11 MED ORDER — ACETAMINOPHEN 650 MG RE SUPP: 650.0000 mg | Freq: Four times a day (QID) | RECTAL | Status: DC | PRN

## 2021-05-11 MED ORDER — FLUTICASONE-UMECLIDIN-VILANT 100-62.5-25 MCG/INH IN AEPB: 1.0000 | INHALATION_SPRAY | Freq: Every day | RESPIRATORY_TRACT | Status: DC

## 2021-05-11 MED ORDER — ACETAMINOPHEN 325 MG PO TABS: 650.0000 mg | ORAL_TABLET | Freq: Four times a day (QID) | ORAL | Status: DC | PRN

## 2021-05-11 MED ORDER — POTASSIUM CHLORIDE CRYS ER 20 MEQ PO TBCR
20.0000 meq | EXTENDED_RELEASE_TABLET | Freq: Every day | ORAL | Status: DC
  Administered 2021-05-12: 20 meq via ORAL
  Filled 2021-05-11: qty 1

## 2021-05-11 MED ORDER — FLUTICASONE FUROATE-VILANTEROL 100-25 MCG/INH IN AEPB
1.0000 | INHALATION_SPRAY | Freq: Every day | RESPIRATORY_TRACT | Status: DC
  Filled 2021-05-11: qty 28

## 2021-05-11 MED ORDER — VITAMIN B-12 1000 MCG PO TABS
1000.0000 ug | ORAL_TABLET | Freq: Every day | ORAL | Status: DC
  Administered 2021-05-11 – 2021-05-12 (×2): 1000 ug via ORAL
  Filled 2021-05-11 (×2): qty 1

## 2021-05-11 MED ORDER — METHYLPREDNISOLONE SODIUM SUCC 40 MG IJ SOLR
40.0000 mg | Freq: Every day | INTRAMUSCULAR | Status: DC
  Administered 2021-05-11 – 2021-05-12 (×2): 40 mg via INTRAVENOUS
  Filled 2021-05-11 (×2): qty 1

## 2021-05-11 MED ORDER — HYDROCODONE-ACETAMINOPHEN 5-325 MG PO TABS: 1.0000 | ORAL_TABLET | ORAL | Status: DC | PRN

## 2021-05-11 MED ORDER — TRAZODONE HCL 100 MG PO TABS
100.0000 mg | ORAL_TABLET | Freq: Every day | ORAL | Status: DC
  Administered 2021-05-11: 100 mg via ORAL
  Filled 2021-05-11: qty 1

## 2021-05-11 MED ORDER — DOXYCYCLINE HYCLATE 100 MG PO TABS
100.0000 mg | ORAL_TABLET | Freq: Two times a day (BID) | ORAL | Status: DC
  Administered 2021-05-11 – 2021-05-12 (×3): 100 mg via ORAL
  Filled 2021-05-11 (×3): qty 1

## 2021-05-11 MED ORDER — FERROUS SULFATE 325 (65 FE) MG PO TABS
325.0000 mg | ORAL_TABLET | Freq: Every day | ORAL | Status: DC
  Administered 2021-05-11 – 2021-05-12 (×2): 325 mg via ORAL
  Filled 2021-05-11 (×2): qty 1

## 2021-05-11 MED ORDER — ONDANSETRON HCL 4 MG PO TABS: 4.0000 mg | ORAL_TABLET | Freq: Four times a day (QID) | ORAL | Status: DC | PRN

## 2021-05-11 MED ORDER — CALCIUM GLUCONATE-NACL 1-0.675 GM/50ML-% IV SOLN
1.0000 g | Freq: Once | INTRAVENOUS | Status: AC
  Administered 2021-05-11: 1000 mg via INTRAVENOUS
  Filled 2021-05-11: qty 50

## 2021-05-11 MED ORDER — CYCLOSPORINE 0.05 % OP EMUL
1.0000 [drp] | Freq: Two times a day (BID) | OPHTHALMIC | Status: DC
  Administered 2021-05-11 – 2021-05-12 (×3): 1 [drp] via OPHTHALMIC
  Filled 2021-05-11 (×4): qty 1

## 2021-05-11 MED ORDER — MAGNESIUM SULFATE 2 GM/50ML IV SOLN
2.0000 g | Freq: Once | INTRAVENOUS | Status: AC
  Administered 2021-05-11: 2 g via INTRAVENOUS
  Filled 2021-05-11: qty 50

## 2021-05-11 MED ORDER — ALBUTEROL SULFATE (2.5 MG/3ML) 0.083% IN NEBU
2.5000 mg | INHALATION_SOLUTION | Freq: Three times a day (TID) | RESPIRATORY_TRACT | Status: DC
  Administered 2021-05-11 – 2021-05-12 (×3): 2.5 mg via RESPIRATORY_TRACT
  Filled 2021-05-11 (×3): qty 3

## 2021-05-11 MED ORDER — ENOXAPARIN SODIUM 40 MG/0.4ML IJ SOSY
40.0000 mg | PREFILLED_SYRINGE | INTRAMUSCULAR | Status: DC
  Administered 2021-05-11: 40 mg via SUBCUTANEOUS
  Filled 2021-05-11 (×2): qty 0.4

## 2021-05-11 MED ORDER — INSULIN ASPART 100 UNIT/ML IJ SOLN
0.0000 [IU] | Freq: Every day | INTRAMUSCULAR | Status: DC
Start: 2021-05-11 — End: 2021-05-12

## 2021-05-11 NOTE — H&P (Signed)
History and Physical    Derek Haney CHE:[id number] DOB: 21-Apr-1933 DOA: 05/10/2021  PCP: Rusty Aus, MD   Patient coming from: Home  I have personally briefly reviewed patient's old medical records in [place]  Chief Complaint: Cough, wheezing  HPI: Derek Haney is a 87 y.o. male with medical history significant for CAD, DM, COPD on home O2 at 2 L and HTN, who presents to the ED with a 4-day history of shortness of breath and wheezing associated with a cough productive of greenish phlegm and low-grade fever.  He denied chest pain, palpitations, nausea vomiting or diaphoresis.  Denied abdominal pain or diarrhea ED course: On arrival, he was afebrile with normal vitals O2 sat mid 90s on O2 at 2 L.  Blood work significant for leukocytosis of 11,000.  Troponin of 6, BNP 105.  Chemistry significant for potassium of 2.2 and calcium of 5.1.  Bicarb of 14 EKG, personally reviewed and interpreted: Sinus rhythm at 75 with left bundle branch block Imaging: Chest x-ray no acute abnormality  Patient was treated in the ED with duo nebs with improvement in her shortness of breath.  He had an IV repletion of calcium and potassium.  Hospitalist consulted for admission for electrolyte disturbance.  Review of Systems: As per HPI otherwise all other systems on review of systems negative.    Past Medical History:  Diagnosis Date   Anemia    Cancer ([place])    basal cell   CHF (congestive heart failure) (HCC)    Coronary artery disease    Dysrhythmia    GERD (gastroesophageal reflux disease)    Glaucoma (increased eye pressure)    Hypertension    Moderate COPD (chronic obstructive pulmonary disease) ([place]) 06/19/2018   Myocardial infarction [facility])     Past Surgical History:  Procedure Laterality Date   CORONARY ANGIOPLASTY     TONSILLECTOMY     TOTAL HIP ARTHROPLASTY Right 03/21/2016   Procedure: TOTAL HIP ARTHROPLASTY;  Surgeon: [name], MD;  Location: [facility] ORS;  Service:  Orthopedics;  Laterality: Right;     reports that he has quit smoking. His smoking use included cigarettes. He has a 15.00 pack-year smoking history. He has quit using smokeless tobacco.  His smokeless tobacco use included chew. He reports current alcohol use. He reports that he does not use drugs.  Allergies  Allergen Reactions   Aspirin Other (See Comments)    Occult blood in stool; higher doses- Pt can take small dose coated tablet   Penicillins Rash    History reviewed. No pertinent family history.    Prior to Admission medications   Medication Sig Start Date End Date Taking? Authorizing Provider  doxycycline (VIBRAMYCIN) 50 MG capsule Take 2 capsules (100 mg total) by mouth 2 (two) times daily for 5 days. 05/10/21 05/15/21 Yes Naaman Plummer, MD  methylPREDNISolone (MEDROL DOSEPAK) 4 MG TBPK tablet Follow instructions in package 05/10/21  Yes Bradler, Vista Lawman, MD  acetaminophen (ACETAMINOPHEN 8 HOUR) 650 MG CR tablet Take 1,300 mg by mouth every 8 (eight) hours as needed for pain.    [provider]  albuterol (PROVENTIL HFA;VENTOLIN HFA) 108 (90 Base) MCG/ACT inhaler Inhale 1-2 puffs into the lungs every 6 (six) hours as needed for wheezing or shortness of breath. 06/03/18   Wilhelmina Mcardle, MD  atorvastatin (LIPITOR) 20 MG tablet Take 20 mg by mouth daily at 6 PM.    [provider]  bimatoprost (LUMIGAN) 0.01 % SOLN Place 1  drop into both eyes at bedtime.    [provider]  cycloSPORINE (RESTASIS) 0.05 % ophthalmic emulsion Place 1 drop into both eyes 2 (two) times daily.    [provider]  dorzolamide (TRUSOPT) 2 % ophthalmic solution Place 1 drop into both eyes 2 (two) times daily.    [provider]  furosemide (LASIX) 20 MG tablet Take 20 mg by mouth daily.    [provider]  Glucos-Chondroit-Hyaluron-MSM (GLUCOSAMINE CHONDROITIN JOINT) TABS Take 2 tablets by mouth every morning.    [provider]  ISOSORBIDE  MONONITRATE ER PO Take 30 mg by mouth 3 (three) times daily.    [provider]  pantoprazole (PROTONIX) 40 MG tablet Take 40 mg by mouth daily.    [provider]  potassium chloride SA (K-DUR,KLOR-CON) 20 MEQ tablet Take 20 mEq by mouth daily.    [provider]  propranolol (INDERAL) 20 MG tablet Take 20 mg by mouth 4 (four) times daily.    [provider]  traMADol (ULTRAM) 50 MG tablet Take 1-2 tablets (50-100 mg total) by mouth every 4 (four) hours as needed for moderate pain. 03/22/16   Watt Climes, PA  TRELEGY ELLIPTA 100-62.5-25 MCG/INH AEPB INHALE 1 PUFFS BY MOUTH ONCE DAILY 04/08/19   Wilhelmina Mcardle, MD    Physical Exam: Vitals:   05/10/21 2230 05/10/21 2330 05/11/21 0030 05/11/21 0100  BP: (!) 120/53 (!) 112/56 107/64 127/68  Pulse: 65 65 (!) 59 (!) 58  Resp: 20 18 18 17   SpO2: 94% 93% 94% 96%  Weight:      Height:         Vitals:   05/10/21 2230 05/10/21 2330 05/11/21 0030 05/11/21 0100  BP: (!) 120/53 (!) 112/56 107/64 127/68  Pulse: 65 65 (!) 59 (!) 58  Resp: 20 18 18 17   SpO2: 94% 93% 94% 96%  Weight:      Height:          Constitutional: Sleeping but easily arousable. Not in any apparent distress HEENT:      Head: Normocephalic and atraumatic.         Eyes: PERLA, EOMI, Conjunctivae are normal. Sclera is non-icteric.       Mouth/Throat: Mucous membranes are moist.       Neck: Supple with no signs of meningismus. Cardiovascular: Regular rate and rhythm. No murmurs, gallops, or rubs. 2+ symmetrical distal pulses are present . No JVD. No LE edema Respiratory: Respiratory effort normal .Lungs sounds clear bilaterally. No wheezes, crackles, or rhonchi.  Gastrointestinal: Soft, non tender, and non distended with positive bowel sounds.  Genitourinary: No CVA tenderness. Musculoskeletal: Nontender with normal range of motion in all extremities. No cyanosis, or erythema of extremities. Neurologic:  Face is symmetric. Moving all  extremities. No gross focal neurologic deficits . Skin: Skin is warm, dry.  No rash or ulcers Psychiatric: Mood and affect are normal    Labs on Admission: I have personally reviewed following labs and imaging studies  CBC: Recent Labs  Lab 05/10/21 2340  WBC 11.1*  NEUTROABS 8.9*  HGB 11.1*  HCT 32.9*  MCV 94.0  PLT 970   Basic Metabolic Panel: Recent Labs  Lab 05/10/21 2340  NA 140  K 2.2*  CL 123*  CO2 14*  GLUCOSE 105*  BUN 10  CREATININE 0.45*  CALCIUM 5.1*   GFR: Estimated Creatinine Clearance: 75.9 mL/min (A) (by C-G formula based on SCr of 0.45 mg/dL (L)). Liver Function Tests: Recent  Labs  Lab 05/10/21 2340  AST 16  ALT 12  ALKPHOS 39  BILITOT 0.6  PROT 4.2*  ALBUMIN 1.8*   No results for input(s): LIPASE, AMYLASE in the last 168 hours. No results for input(s): AMMONIA in the last 168 hours. Coagulation Profile: No results for input(s): INR, PROTIME in the last 168 hours. Cardiac Enzymes: No results for input(s): CKTOTAL, CKMB, CKMBINDEX, TROPONINI in the last 168 hours. BNP (last 3 results) No results for input(s): PROBNP in the last 8760 hours. HbA1C: No results for input(s): HGBA1C in the last 72 hours. CBG: No results for input(s): GLUCAP in the last 168 hours. Lipid Profile: No results for input(s): CHOL, HDL, LDLCALC, TRIG, CHOLHDL, LDLDIRECT in the last 72 hours. Thyroid Function Tests: No results for input(s): TSH, T4TOTAL, FREET4, T3FREE, THYROIDAB in the last 72 hours. Anemia Panel: No results for input(s): VITAMINB12, FOLATE, FERRITIN, TIBC, IRON, RETICCTPCT in the last 72 hours. Urine analysis:    Component Value Date/Time   COLORURINE YELLOW (A) 03/07/2016 1514   APPEARANCEUR CLEAR (A) 03/07/2016 1514   LABSPEC 1.017 03/07/2016 1514   PHURINE 5.0 03/07/2016 1514   GLUCOSEU NEGATIVE 03/07/2016 1514   HGBUR NEGATIVE 03/07/2016 1514   BILIRUBINUR NEGATIVE 03/07/2016 1514   KETONESUR NEGATIVE 03/07/2016 1514   PROTEINUR  NEGATIVE 03/07/2016 1514   NITRITE NEGATIVE 03/07/2016 1514   LEUKOCYTESUR NEGATIVE 03/07/2016 1514    Radiological Exams on Admission: DG Chest 2 View  Result Date: 05/10/2021 CLINICAL DATA:  Productive cough and shortness of breath EXAM: CHEST - 2 VIEW COMPARISON:  07/28/2018 FINDINGS: Cardiac shadow is within normal limits. Aortic calcifications are noted. Lungs are well aerated bilaterally with chronic interstitial markings stable from prior CT examination. Likely calcified granuloma is noted anterior to the spine on the lateral film but not well visualized on the frontal film. No focal infiltrate or effusion is seen. Degenerative changes of the thoracic spine are noted. IMPRESSION: No acute abnormality noted. Likely calcified granuloma anterior to the spine on the lateral film only. Electronically Signed   By: Inez Catalina M.D.   On: 05/10/2021 21:52     Assessment/Plan 87 year old male with history of CAD, DM, COPD on home O2 at 2 L and HTN, who presents to the ED with cough and wheezing.  Routine blood work revealing low potassium and calcium    Hypocalcemia -Calcium of 5.1, status post Calcium gluconate - Etiology uncertain.  Check mag ,phosphate and PTH - Monitor and replete as necessary - Etiology uncertain    Hypokalemia - Potassium of 2.2 possibly related to furosemide - Monitor and replete as needed - Received IV repletion in the emergency room - Continuous cardiac monitoring    CAD (coronary artery disease) - No complaints of chest pain - Continue aspirin, atorvastatin and propranolol and isosorbide    Moderate COPD with mild exacerbation Chronic respiratory failure with hypoxia - Wheezing resolved with duo nebs in the ED - Continue DuoNebs as needed and home inhalers - Chest x-ray showed no acute findings - O2 needs at baseline   Diabetes mellitus, type II ([place]) -Sliding scale coverage    DVT prophylaxis: Lovenox  Code Status: full code  Family Communication:   none  Disposition Plan: Back to previous home environment Consults called: none  Status: Observation    Athena Masse MD [facility]     05/11/2021, 1:35 AM

## 2021-05-11 NOTE — Progress Notes (Signed)
Rm assignment given - rm 140/ transported to rm via wheelchair

## 2021-05-11 NOTE — Evaluation (Signed)
Physical Therapy Evaluation Patient Details Name: Derek Haney MRN: [id number] DOB: 26-Mar-1933 Today's Date: 05/11/2021   History of Present Illness  Pt is a 87 y.o. male with medical history significant for CAD, DM, COPD on home O2 at 2 L and HTN, who presents to the ED with a 4-day history of shortness of breath and wheezing associated with a cough productive of greenish phlegm and low-grade fever. MD assessment includes moderate COPD with mild exacerbation, hypocalcemia, and hypokalemia.  Clinical Impression  Pt was pleasant and motivated to participate during the session. Pt's SpO2 ranged from 88-100%maintaining at low 90s primarily while ambulating 175ft. Pt's SpO2  dropped to 88%towards end of walk and quickly returned to >90%. Pt's SpO2 dropped to 86% while talking sitting at EOB after the walk but returned to >90% shortly after verbal cues for breathing sequencing. Pt reported some SOB halfway through walk but resolved quickly after short static standing break. Pt required no physical assistance for functional mobility. Pt may benefit from balance training secondary to intermittent unsteadiness and poor SPC sequencing while ambulating. Pt will benefit from HHPT upon discharge to safely address deficits listed in patient problem list for decreased caregiver assistance and eventual return to PLOF.      Follow Up Recommendations Home health PT, Supervision OOB/Mobility    Equipment Recommendations  None recommended by PT    Recommendations for Other Services       Precautions / Restrictions Precautions Precautions: Fall Restrictions Weight Bearing Restrictions: No      Mobility  Bed Mobility Overal bed mobility: Independent                  Transfers Overall transfer level: Modified independent Equipment used: Straight cane                Ambulation/Gait Ambulation/Gait assistance: Min guard Gait Distance (Feet): 150 Feet Assistive device: Straight  cane Gait Pattern/deviations: Step-through pattern;Decreased step length - right;Decreased step length - left;Decreased stride length;Drifts right/left Gait velocity: decreased   General Gait Details: Pt demonstraited short step length and stride length and appeared unsteady at times drifting to the right and left but pt able to self correct without physical assistance.  Stairs            Wheelchair Mobility    Modified Rankin (Stroke Patients Only)       Balance Overall balance assessment: Needs assistance Sitting-balance support: Feet supported Sitting balance-Leahy Scale: Normal     Standing balance support: Single extremity supported Standing balance-Leahy Scale: Good                               Pertinent Vitals/Pain Pain Assessment: No/denies pain    Home Living Family/patient expects to be discharged to:: Private residence Living Arrangements: Spouse/significant other Available Help at Discharge: Family;Available 24 hours/day (pt's wife is in hospital currently but son is able to provide 24hr assist) Type of Home: House Home Access: Stairs to enter Entrance Stairs-Rails: Can reach both Entrance Stairs-Number of Steps: 3 Home Layout: Two level;Able to live on main level with bedroom/bathroom Home Equipment: Walker - 4 wheels;Shower seat - built in;Grab bars - tub/shower;Walker - 2 wheels;Other (comment) (Reports that he might have 2 wheel RW)      Prior Function Level of Independence: Independent with assistive device(s)         Comments: Pt performs all ADLs independently. Pt reports using SPC for community ambulation, rollator for  long distances, and no AD in the house. Pt goes to gym 2x per wk. No fall history.     Hand Dominance   Dominant Hand: Right    Extremity/Trunk Assessment   Upper Extremity Assessment Upper Extremity Assessment: Overall WFL for tasks assessed    Lower Extremity Assessment Lower Extremity Assessment:  Overall WFL for tasks assessed       Communication   Communication: No difficulties  Cognition Arousal/Alertness: Awake/alert Behavior During Therapy: WFL for tasks assessed/performed Overall Cognitive Status: Within Functional Limits for tasks assessed                                        General Comments      Exercises Other Exercises Other Exercises: Static standing 2-3 min to improve activity tolerance. Other Exercises: Education on benefits of HHPT provided to pt verbally and pt said he would think about   Assessment/Plan    PT Assessment Patient needs continued PT services  PT Problem List Decreased activity tolerance;Decreased balance;Decreased knowledge of use of DME;Decreased safety awareness       PT Treatment Interventions DME instruction;Gait training;Stair training;Functional mobility training;Therapeutic activities;Therapeutic exercise;Balance training;Patient/family education    PT Goals (Current goals can be found in the Care Plan section)  Acute Rehab PT Goals Patient Stated Goal: Get back home PT Goal Formulation: With patient Time For Goal Achievement: 05/24/21 Potential to Achieve Goals: Good    Frequency Min 2X/week   Barriers to discharge        Co-evaluation               AM-PAC PT "6 Clicks" Mobility  Outcome Measure Help needed turning from your back to your side while in a flat bed without using bedrails?: None Help needed moving from lying on your back to sitting on the side of a flat bed without using bedrails?: None Help needed moving to and from a bed to a chair (including a wheelchair)?: None Help needed standing up from a chair using your arms (e.g., wheelchair or bedside chair)?: None Help needed to walk in hospital room?: None Help needed climbing 3-5 steps with a railing? : A Little 6 Click Score: 23    End of Session Equipment Utilized During Treatment: Gait belt Activity Tolerance: Patient tolerated  treatment well Patient left: in bed;with call bell/phone within reach;with bed alarm set;with family/visitor present Nurse Communication: Mobility status;Other (comment) (O2 while ambulating per above) PT Visit Diagnosis: Unsteadiness on feet (R26.81);Difficulty in walking, not elsewhere classified (R26.2)    Time: 2094-7096 PT Time Calculation (min) (ACUTE ONLY): 29 min   Charges:              Dayton Scrape SPT 05/11/21, 5:24 PM

## 2021-05-11 NOTE — Progress Notes (Signed)
Patient ID: Derek Haney, male   DOB: 10-01-1933, 87 y.o.   MRN: [id number] [facility] PROGRESS NOTE  Derek Haney ALP:[id number] DOB: 03/29/1933 DOA: 05/10/2021 PCP: Rusty Aus, MD  HPI/Subjective: Patient's main complaint was shortness of breath and cough and wheezing.  History of COPD but only wears oxygen at night.  Came in because he could not breathe.  Also found to have electrolyte abnormalities with low potassium and low calcium.  Objective: Vitals:   05/11/21 0738 05/11/21 1132  BP:  134/82  Pulse:    Resp:  13  Temp: 97.7 F (36.5 C)   SpO2:  96%    Intake/Output Summary (Last 24 hours) at 05/11/2021 1214 Last data filed at 05/11/2021 0658 Gross per 24 hour  Intake --  Output 700 ml  Net -700 ml   Filed Weights   05/10/21 2059  Weight: 93 kg    ROS: Review of Systems  Respiratory:  Positive for cough, shortness of breath and wheezing.   Cardiovascular:  Negative for chest pain.  Gastrointestinal:  Negative for abdominal pain, nausea and vomiting.  Exam: Physical Exam HENT:     Head: Normocephalic.     Mouth/Throat:     Pharynx: No oropharyngeal exudate.  Eyes:     General: Lids are normal.     Conjunctiva/sclera: Conjunctivae normal.     Pupils: Pupils are equal, round, and reactive to light.  Cardiovascular:     Rate and Rhythm: Normal rate and regular rhythm.     Heart sounds: Normal heart sounds, S1 normal and S2 normal.  Pulmonary:     Breath sounds: Examination of the right-middle field reveals decreased breath sounds and wheezing. Examination of the left-middle field reveals decreased breath sounds and wheezing. Examination of the right-lower field reveals decreased breath sounds and wheezing. Examination of the left-lower field reveals decreased breath sounds and wheezing. Decreased breath sounds and wheezing present. No rhonchi or rales.  Abdominal:     Palpations: Abdomen is soft.     Tenderness: There is no abdominal tenderness.   Musculoskeletal:     Right ankle: No swelling.     Left ankle: No swelling.  Skin:    General: Skin is warm.     Findings: No rash.  Neurological:     Mental Status: He is alert and oriented to person, place, and time.     Data Reviewed: Basic Metabolic Panel: Recent Labs  Lab 05/10/21 2340 05/11/21 0002 05/11/21 0623  NA 140  --  138  K 2.2*  --  5.1  CL 123*  --  106  CO2 14*  --  25  GLUCOSE 105*  --  190*  BUN 10  --  18  CREATININE 0.45*  --  0.83  CALCIUM 5.1*  --  9.2  MG  --  2.2  --   PHOS  --  3.2  --    Liver Function Tests: Recent Labs  Lab 05/10/21 2340  AST 16  ALT 12  ALKPHOS 39  BILITOT 0.6  PROT 4.2*  ALBUMIN 1.8*   CBC: Recent Labs  Lab 05/10/21 2340 05/11/21 0623  WBC 11.1* 8.8  NEUTROABS 8.9*  --   HGB 11.1* 11.0*  HCT 32.9* 32.8*  MCV 94.0 94.8  PLT 242 231    BNP (last 3 results) Recent Labs    05/10/21 2340  BNP 105.5*     CBG: Recent Labs  Lab 05/11/21 0412 05/11/21 0733 05/11/21 1124  GLUCAP 185* 174*  200*    Recent Results (from the past 240 hour(s))  Resp Panel by RT-PCR (Flu A&B, Covid) Nasopharyngeal Swab     Status: None   Collection Time: 05/11/21  1:55 AM   Specimen: Nasopharyngeal Swab; Nasopharyngeal(NP) swabs in vial transport medium  Result Value Ref Range Status   SARS Coronavirus 2 by RT PCR NEGATIVE NEGATIVE Final    Comment: (NOTE) SARS-CoV-2 target nucleic acids are NOT DETECTED.  The SARS-CoV-2 RNA is generally detectable in upper respiratory specimens during the acute phase of infection. The lowest concentration of SARS-CoV-2 viral copies this assay can detect is 138 copies/mL. A negative result does not preclude SARS-Cov-2 infection and should not be used as the sole basis for treatment or other patient management decisions. A negative result may occur with  improper specimen collection/handling, submission of specimen other than nasopharyngeal swab, presence of viral mutation(s) within  the areas targeted by this assay, and inadequate number of viral copies(<138 copies/mL). A negative result must be combined with clinical observations, patient history, and epidemiological information. The expected result is Negative.  Fact Sheet for Patients:  EntrepreneurPulse.com.au  Fact Sheet for Healthcare Providers:  IncredibleEmployment.be  This test is no t yet approved or cleared by the [place] FDA and  has been authorized for detection and/or diagnosis of SARS-CoV-2 by FDA under an Emergency Use Authorization (EUA). This EUA will remain  in effect (meaning this test can be used) for the duration of the COVID-19 declaration under Section 564(b)(1) of the Act, 21 U.S.C.section 360bbb-3(b)(1), unless the authorization is terminated  or revoked sooner.       Influenza A by PCR NEGATIVE NEGATIVE Final   Influenza B by PCR NEGATIVE NEGATIVE Final    Comment: (NOTE) The Xpert Xpress SARS-CoV-2/FLU/RSV plus assay is intended as an aid in the diagnosis of influenza from Nasopharyngeal swab specimens and should not be used as a sole basis for treatment. Nasal washings and aspirates are unacceptable for Xpert Xpress SARS-CoV-2/FLU/RSV testing.  Fact Sheet for Patients: EntrepreneurPulse.com.au  Fact Sheet for Healthcare Providers: IncredibleEmployment.be  This test is not yet approved or cleared by the [place] FDA and has been authorized for detection and/or diagnosis of SARS-CoV-2 by FDA under an Emergency Use Authorization (EUA). This EUA will remain in effect (meaning this test can be used) for the duration of the COVID-19 declaration under Section 564(b)(1) of the Act, 21 U.S.C. section 360bbb-3(b)(1), unless the authorization is terminated or revoked.  Performed at [facility], [address], [place], [place] [address]      Studies: DG Chest 2 View  Result Date:  05/11/2021 CLINICAL DATA:  Cough and shortness of breath. EXAM: CHEST - 2 VIEW COMPARISON:  05/10/2021 and older exams.  CT, 03/07/2018. FINDINGS: Cardiac silhouette is normal in size. No mediastinal or hilar masses. No evidence of adenopathy. Lungs are hyperexpanded. There are thickened interstitial markings most evident in the lower lungs. Additional linear/reticular opacities in anterior lung bases consistent with scarring. No lung consolidation to suggest pneumonia and no evidence of pulmonary edema. Nodule noted in the lower lung on the previous day's lateral chest radiograph is not visualized on the current exam. No pleural effusion or pneumothorax. Skeletal structures are intact IMPRESSION: 1. No acute cardiopulmonary disease. 2. No current evidence of a lung nodule. Electronically Signed   By: Lajean Manes M.D.   On: 05/11/2021 11:48   DG Chest 2 View  Result Date: 05/10/2021 CLINICAL DATA:  Productive cough and shortness of breath  EXAM: CHEST - 2 VIEW COMPARISON:  07/28/2018 FINDINGS: Cardiac shadow is within normal limits. Aortic calcifications are noted. Lungs are well aerated bilaterally with chronic interstitial markings stable from prior CT examination. Likely calcified granuloma is noted anterior to the spine on the lateral film but not well visualized on the frontal film. No focal infiltrate or effusion is seen. Degenerative changes of the thoracic spine are noted. IMPRESSION: No acute abnormality noted. Likely calcified granuloma anterior to the spine on the lateral film only. Electronically Signed   By: Inez Catalina M.D.   On: 05/10/2021 21:52    Scheduled Meds:  albuterol  2.5 mg Nebulization TID   atorvastatin  20 mg Oral q1800   cycloSPORINE  1 drop Both Eyes BID   dorzolamide  1 drop Both Eyes BID   enoxaparin (LOVENOX) injection  40 mg Subcutaneous Q24H   ferrous sulfate  325 mg Oral Q breakfast   Fluticasone-Umeclidin-Vilant  1 Inhaler Inhalation Daily   furosemide  20 mg Oral  Daily   insulin aspart  0-15 Units Subcutaneous TID WC   insulin aspart  0-5 Units Subcutaneous QHS   isosorbide mononitrate  30 mg Oral TID   latanoprost  1 drop Both Eyes QHS   methylPREDNISolone (SOLU-MEDROL) injection  40 mg Intravenous Daily   pantoprazole  40 mg Oral Daily   [START ON 05/12/2021] potassium chloride SA  20 mEq Oral Daily   propranolol  20 mg Oral QID   traZODone  100 mg Oral QHS   cyanocobalamin  1,000 mcg Oral Daily     Assessment/Plan:  COPD exacerbation.  Start Solu-Medrol.  We will give empiric doxycycline.  Repeat chest x-ray does not show pneumonia.  Nebulizer treatments, Trelegy inhaler.  Try to get off oxygen during the day.  Only wears oxygen at night. Hypocalcemia.  Calcium improved with 1 dose of IV calcium.  Seems to me that the first 1 was a lab error.  We will check again tomorrow.  Checking vitamin D level.  PTH pending. Hypokalemia.  Again with very little supplementation potassium now on the higher range.  Recheck again tomorrow. History of CAD on aspirin, atorvastatin and propranolol and Imdur Weakness physical therapy evaluation History of chronic diastolic congestive heart failure.  No signs of heart failure currently Elevated blood sugar.  Sliding scale insulin while on steroids.  Checking a hemoglobin A1c to see if he has diabetes.        Code Status:     Code Status Orders  (From admission, onward)           Start     Ordered   05/11/21 0134  Full code  Continuous        05/11/21 0135           Code Status History     This patient has a current code status but no historical code status.      Advance Directive Documentation    [place] Most Recent Value  Type of Advance Directive Living will  Pre-existing out of facility DNR order (yellow form or pink MOST form) --  "MOST" Form in Place? --      Family Communication: Permission to speak in front of son and grandson and friend at the bedside Disposition  Plan: Status is: Observation  Dispo: The patient is from: Home              Anticipated d/c is to: Home  Patient currently starting steroids for COPD exacerbation.  Only wears oxygen at night would like to get him off oxygen during the day.   Difficult to place patient.  No  Time spent: 31 minutes  [place]

## 2021-05-12 DIAGNOSIS — J441 Chronic obstructive pulmonary disease with (acute) exacerbation: Secondary | ICD-10-CM | POA: Diagnosis not present

## 2021-05-12 DIAGNOSIS — J9601 Acute respiratory failure with hypoxia: Secondary | ICD-10-CM

## 2021-05-12 DIAGNOSIS — R7301 Impaired fasting glucose: Secondary | ICD-10-CM

## 2021-05-12 DIAGNOSIS — E876 Hypokalemia: Secondary | ICD-10-CM | POA: Diagnosis not present

## 2021-05-12 LAB — BASIC METABOLIC PANEL
Anion gap: 10 (ref 5–15)
BUN: 25 mg/dL — ABNORMAL HIGH (ref 8–23)
CO2: 25 mmol/L (ref 22–32)
Calcium: 9.2 mg/dL (ref 8.9–10.3)
Chloride: 102 mmol/L (ref 98–111)
Creatinine, Ser: 0.93 mg/dL (ref 0.61–1.24)
GFR, Estimated: >60 mL/min (ref >60)
Glucose, Bld: 107 mg/dL — ABNORMAL HIGH (ref 70–99)
Potassium: 3.9 mmol/L (ref 3.5–5.1)
Sodium: 137 mmol/L (ref 135–145)

## 2021-05-12 LAB — MAGNESIUM: Magnesium: 2.2 mg/dL (ref 1.7–2.4)

## 2021-05-12 LAB — PARATHYROID HORMONE, INTACT (NO CA): PTH: 21 pg/mL (ref 15–65)

## 2021-05-12 LAB — GLUCOSE, CAPILLARY: Glucose-Capillary: 110 mg/dL — ABNORMAL HIGH (ref 70–99)

## 2021-05-12 MED ORDER — BENZONATATE 100 MG PO CAPS: 100.0000 mg | ORAL_CAPSULE | Freq: Three times a day (TID) | ORAL | 0 refills | Status: DC | PRN

## 2021-05-12 MED ORDER — PROPRANOLOL HCL 20 MG PO TABS
10.0000 mg | ORAL_TABLET | Freq: Four times a day (QID) | ORAL | Status: DC
  Administered 2021-05-12: 10 mg via ORAL
  Filled 2021-05-12: qty 1

## 2021-05-12 NOTE — Progress Notes (Signed)
Patient is A & O and able to make needs known. Son, [name] Daily, was present throughout discharge process. AVS was reviewed with both who verbalized understanding via teach back re medications, follow up appointments, signs and symptoms to report to MD as well as limitations and restrictions. Patient will be transported home via [facility] in private vehicle. Both aware Benzonatate, doxycycline and methyiprednisolone have been called into [facility] in [place] for pickup after discharge.

## 2021-05-12 NOTE — TOC Progression Note (Signed)
Transition of Care [facility]) - Progression Note    Patient Details  Name: Derek Haney MRN: [id number] Date of Birth: Apr 07, 1933  Transition of Care [facility]) CM/SW [place], RN Phone Number: 05/12/2021, 10:17 AM  Clinical Narrative:      Met with the patient to discuss DC plana and needs He stated he has oxygen at home that he only uses at night, has a Rollator and a RW, he has a shower seat, He goes to a program and walks 2 miles 2 times a week and does not want [place] services, he has transportation with his Son and he can afford his medication, he stated he has no needs at this time      Expected Discharge Plan and Services           Expected Discharge Date: 05/12/21                                     Social Determinants of Health (SDOH) Interventions    Readmission Risk Interventions No flowsheet data found.

## 2021-05-12 NOTE — Discharge Summary (Signed)
[place] [facility] [place] NAME: Derek Haney    MR#:  [id number]  DATE OF BIRTH:  10-14-1933  DATE OF ADMISSION:  05/10/2021 ADMITTING PHYSICIAN: Loletha Grayer, MD  DATE OF DISCHARGE: 05/12/2021 12:16 PM  PRIMARY CARE PHYSICIAN: Rusty Aus, MD    ADMISSION DIAGNOSIS:  Hypocalcemia [E83.51] Shortness of breath [R06.02] Cough [R05.9] Hypokalemia [E87.6] COPD exacerbation (HCC) [J44.1]  DISCHARGE DIAGNOSIS:  Principal Problem:   Hypocalcemia Active Problems:   CAD (coronary artery disease)   Benign essential hypertension   Moderate COPD (chronic obstructive pulmonary disease) ([place])   Diabetes mellitus, type II (HCC)   Hypokalemia   Chronic respiratory failure with hypoxia (HCC)   COPD exacerbation ([place])   SECONDARY DIAGNOSIS:   Past Medical History:  Diagnosis Date   Anemia    Cancer ([place])    basal cell   CHF (congestive heart failure) (HCC)    Coronary artery disease    Dysrhythmia    GERD (gastroesophageal reflux disease)    Glaucoma (increased eye pressure)    Hypertension    Moderate COPD (chronic obstructive pulmonary disease) ([place]) 06/19/2018   Myocardial infarction [facility])     HOSPITAL COURSE:   COPD exacerbation I started Solu-Medrol on 05/12/2023.  Patient is on trilogy inhaler.  I gave nebulizers here in the hospital.  Patient feeling much better at the time of discharge.  On presentation had quite a bit of wheeze.  Upon discharge after coughing lungs had less wheeze then admission.  Continue Trelegy inhaler.  ER physician prescribed doxycycline and a Medrol Dosepak which is fine to take to completion.  Patient was able to come off oxygen and held her saturations with ambulation. Acute hypoxic respiratory failure yesterday with ambulation pulse ox went to 88%.  Today with ambulation pulse ox stayed above 90. Hypocalcemia.  Calcium improved with 1 dose of IV calcium.  Likely lab error.  Calcium upon discharge 9.2.   Vitamin D level normal range Hypokalemia.  Again with 1 dose of potassium potassium was up in the normal range.  Suspect lab error on initial blood draw. History of CAD on aspirin atorvastatin, propranolol and Imdur Impaired fasting glucose hemoglobin A1c 6.2 History of chronic diastolic congestive heart failure.  No signs of heart failure currently  DISCHARGE CONDITIONS:   Satisfactory  CONSULTS OBTAINED:    None  DRUG ALLERGIES:   Allergies  Allergen Reactions   Aspirin Other (See Comments)    Occult blood in stool; higher doses- Pt can take small dose coated tablet   Penicillins Rash    DISCHARGE MEDICATIONS:   Allergies as of 05/12/2021       Reactions   Aspirin Other (See Comments)   Occult blood in stool; higher doses- Pt can take small dose coated tablet   Penicillins Rash        Medication List     TAKE these medications    acetaminophen 650 MG CR tablet Commonly known as: TYLENOL Take 1,300 mg by mouth every 8 (eight) hours as needed for pain.   albuterol 108 (90 Base) MCG/ACT inhaler Commonly known as: VENTOLIN HFA Inhale 1-2 puffs into the lungs every 6 (six) hours as needed for wheezing or shortness of breath.   atorvastatin 20 MG tablet Commonly known as: LIPITOR Take 20 mg by mouth daily at 6 PM.   benzonatate 100 MG capsule Commonly known as: TESSALON Take 1 capsule (100 mg total) by mouth 3 (three) times daily as needed  for cough.   bimatoprost 0.01 % Soln Commonly known as: LUMIGAN Place 1 drop into both eyes at bedtime.   cyanocobalamin 1000 MCG tablet Take 1,000 mcg by mouth daily.   cycloSPORINE 0.05 % ophthalmic emulsion Commonly known as: RESTASIS Place 1 drop into both eyes 2 (two) times daily.   dorzolamide 2 % ophthalmic solution Commonly known as: TRUSOPT Place 1 drop into both eyes 2 (two) times daily.   doxycycline 50 MG capsule Commonly known as: VIBRAMYCIN Take 2 capsules (100 mg total) by mouth 2 (two) times daily  for 5 days.   ferrous sulfate 325 (65 FE) MG tablet Take 325 mg by mouth daily with breakfast.   furosemide 20 MG tablet Commonly known as: LASIX Take 20 mg by mouth daily.   Glucosamine Chondroitin Joint Tabs Take 2 tablets by mouth every morning.   ISOSORBIDE MONONITRATE ER PO Take 30 mg by mouth 3 (three) times daily.   methylPREDNISolone 4 MG Tbpk tablet Commonly known as: MEDROL DOSEPAK Follow instructions in package   pantoprazole 40 MG tablet Commonly known as: PROTONIX Take 40 mg by mouth daily.   potassium chloride SA 20 MEQ tablet Commonly known as: KLOR-CON Take 20 mEq by mouth daily.   propranolol 20 MG tablet Commonly known as: INDERAL Take 20 mg by mouth 4 (four) times daily.   traZODone 50 MG tablet Commonly known as: DESYREL Take 100 mg by mouth at bedtime.   Trelegy Ellipta 100-62.5-25 MCG/INH Aepb Generic drug: Fluticasone-Umeclidin-Vilant INHALE 1 PUFFS BY MOUTH ONCE DAILY         DISCHARGE INSTRUCTIONS:  Follow-up PMD 5 days  If you experience worsening of your admission symptoms, develop shortness of breath, life threatening emergency, suicidal or homicidal thoughts you must seek medical attention immediately by calling 911 or calling your MD immediately  if symptoms less severe.  You Must read complete instructions/literature along with all the possible adverse reactions/side effects for all the Medicines you take and that have been prescribed to you. Take any new Medicines after you have completely understood and accept all the possible adverse reactions/side effects.   Please note  You were cared for by a hospitalist during your hospital stay. If you have any questions about your discharge medications or the care you received while you were in the hospital after you are discharged, you can call the unit and asked to speak with the hospitalist on call if the hospitalist that took care of you is not available. Once you are discharged, your  primary care physician will handle any further medical issues. Please note that NO REFILLS for any discharge medications will be authorized once you are discharged, as it is imperative that you return to your primary care physician (or establish a relationship with a primary care physician if you do not have one) for your aftercare needs so that they can reassess your need for medications and monitor your lab values.    Today   CHIEF COMPLAINT:   Chief Complaint  Patient presents with   Cough   Shortness of Breath    HISTORY OF PRESENT ILLNESS:  Derek Haney  is a 87 y.o. male came in with cough and shortness of breath   VITAL SIGNS:  Blood pressure 136/73, pulse 65, temperature 98.5 F (36.9 C), temperature source Oral, resp. rate (!) 23, height 5\' 10"  (1.778 m), weight 93 kg, SpO2 100 %.  I/O:  No intake or output data in the 24 hours ending 05/12/21 1649  PHYSICAL EXAMINATION:  GENERAL:  87 y.o.-year-old patient lying in the bed with no acute distress.  EYES: Pupils equal, round, reactive to light and accommodation. No scleral icterus. Extraocular muscles intact.  HEENT: Head atraumatic, normocephalic. Oropharynx and nasopharynx clear.   LUNGS: Decreased breath sounds bilateral bases, after coughing only heard slight expiratory wheezing at bases. CARDIOVASCULAR: S1, S2 normal. No murmurs, rubs, or gallops.  ABDOMEN: Soft, non-tender, non-distended. Bowel sounds present. No organomegaly or mass.  EXTREMITIES: No pedal edema.  NEUROLOGIC: Cranial nerves II through XII are intact. Muscle strength 5/5 in all extremities. Sensation intact. Gait not checked.  PSYCHIATRIC: The patient is alert and oriented x 3.  SKIN: No obvious rash, lesion, or ulcer.   DATA REVIEW:   CBC Recent Labs  Lab 05/11/21 0623  WBC 8.8  HGB 11.0*  HCT 32.8*  PLT 231    Chemistries  Recent Labs  Lab 05/10/21 2340 05/11/21 0002 05/12/21 0740  NA 140   < > 137  K 2.2*   < > 3.9  CL 123*    < > 102  CO2 14*   < > 25  GLUCOSE 105*   < > 107*  BUN 10   < > 25*  CREATININE 0.45*   < > 0.93  CALCIUM 5.1*   < > 9.2  MG  --    < > 2.2  AST 16  --   --   ALT 12  --   --   ALKPHOS 39  --   --   BILITOT 0.6  --   --    < > = values in this interval not displayed.    Microbiology Results  Results for orders placed or performed during the hospital encounter of 05/10/21  Resp Panel by RT-PCR (Flu A&B, Covid) Nasopharyngeal Swab     Status: None   Collection Time: 05/11/21  1:55 AM   Specimen: Nasopharyngeal Swab; Nasopharyngeal(NP) swabs in vial transport medium  Result Value Ref Range Status   SARS Coronavirus 2 by RT PCR NEGATIVE NEGATIVE Final    Comment: (NOTE) SARS-CoV-2 target nucleic acids are NOT DETECTED.  The SARS-CoV-2 RNA is generally detectable in upper respiratory specimens during the acute phase of infection. The lowest concentration of SARS-CoV-2 viral copies this assay can detect is 138 copies/mL. A negative result does not preclude SARS-Cov-2 infection and should not be used as the sole basis for treatment or other patient management decisions. A negative result may occur with  improper specimen collection/handling, submission of specimen other than nasopharyngeal swab, presence of viral mutation(s) within the areas targeted by this assay, and inadequate number of viral copies(<138 copies/mL). A negative result must be combined with clinical observations, patient history, and epidemiological information. The expected result is Negative.  Fact Sheet for Patients:  EntrepreneurPulse.com.au  Fact Sheet for Healthcare Providers:  IncredibleEmployment.be  This test is no t yet approved or cleared by the [place] FDA and  has been authorized for detection and/or diagnosis of SARS-CoV-2 by FDA under an Emergency Use Authorization (EUA). This EUA will remain  in effect (meaning this test can be used) for the duration of  the COVID-19 declaration under Section 564(b)(1) of the Act, 21 U.S.C.section 360bbb-3(b)(1), unless the authorization is terminated  or revoked sooner.       Influenza A by PCR NEGATIVE NEGATIVE Final   Influenza B by PCR NEGATIVE NEGATIVE Final    Comment: (NOTE) The Xpert Xpress SARS-CoV-2/FLU/RSV plus assay is intended as an  aid in the diagnosis of influenza from Nasopharyngeal swab specimens and should not be used as a sole basis for treatment. Nasal washings and aspirates are unacceptable for Xpert Xpress SARS-CoV-2/FLU/RSV testing.  Fact Sheet for Patients: EntrepreneurPulse.com.au  Fact Sheet for Healthcare Providers: IncredibleEmployment.be  This test is not yet approved or cleared by the [place] FDA and has been authorized for detection and/or diagnosis of SARS-CoV-2 by FDA under an Emergency Use Authorization (EUA). This EUA will remain in effect (meaning this test can be used) for the duration of the COVID-19 declaration under Section 564(b)(1) of the Act, 21 U.S.C. section 360bbb-3(b)(1), unless the authorization is terminated or revoked.  Performed at [facility], [address] [place], [place] [address]   MRSA Next Gen by PCR, Nasal     Status: None   Collection Time: 05/11/21  6:30 PM   Specimen: Nasal Mucosa; Nasal Swab  Result Value Ref Range Status   MRSA by PCR Next Gen NOT DETECTED NOT DETECTED Final    Comment: (NOTE) The GeneXpert MRSA Assay (FDA approved for NASAL specimens only), is one component of a comprehensive MRSA colonization surveillance program. It is not intended to diagnose MRSA infection nor to guide or monitor treatment for MRSA infections. Test performance is not FDA approved in patients less than 62 years old. Performed at [facility], [address] [place], [place] [address]     RADIOLOGY:  DG Chest 2 View  Result Date: 05/11/2021 CLINICAL DATA:  Cough and  shortness of breath. EXAM: CHEST - 2 VIEW COMPARISON:  05/10/2021 and older exams.  CT, 03/07/2018. FINDINGS: Cardiac silhouette is normal in size. No mediastinal or hilar masses. No evidence of adenopathy. Lungs are hyperexpanded. There are thickened interstitial markings most evident in the lower lungs. Additional linear/reticular opacities in anterior lung bases consistent with scarring. No lung consolidation to suggest pneumonia and no evidence of pulmonary edema. Nodule noted in the lower lung on the previous day's lateral chest radiograph is not visualized on the current exam. No pleural effusion or pneumothorax. Skeletal structures are intact IMPRESSION: 1. No acute cardiopulmonary disease. 2. No current evidence of a lung nodule. Electronically Signed   By: Lajean Manes M.D.   On: 05/11/2021 11:48   DG Chest 2 View  Result Date: 05/10/2021 CLINICAL DATA:  Productive cough and shortness of breath EXAM: CHEST - 2 VIEW COMPARISON:  07/28/2018 FINDINGS: Cardiac shadow is within normal limits. Aortic calcifications are noted. Lungs are well aerated bilaterally with chronic interstitial markings stable from prior CT examination. Likely calcified granuloma is noted anterior to the spine on the lateral film but not well visualized on the frontal film. No focal infiltrate or effusion is seen. Degenerative changes of the thoracic spine are noted. IMPRESSION: No acute abnormality noted. Likely calcified granuloma anterior to the spine on the lateral film only. Electronically Signed   By: Inez Catalina M.D.   On: 05/10/2021 21:52     Management plans discussed with the patient, family and they are in agreement.  CODE STATUS:     Code Status Orders  (From admission, onward)           Start     Ordered   05/11/21 0134  Full code  Continuous        05/11/21 0135           Code Status History     This patient has a current code status but no historical code status.  Advance Directive  Documentation    [place] Most Recent Value  Type of Advance Directive Living will  Pre-existing out of facility DNR order (yellow form or pink MOST form) --  "MOST" Form in Place? --       TOTAL TIME TAKING CARE OF THIS PATIENT: 35 minutes.    Loletha Grayer M.D on 05/12/2021 at 4:49 PM  Between 7am to 6pm - Pager - [phone]  After 6pm go to [email or web address] - password [email or web address]  [facility]  CC: Primary care physician; Rusty Aus, MD

## 2022-01-10 ENCOUNTER — Encounter: Payer: Self-pay | Admitting: Ophthalmology

## 2022-01-15 NOTE — Discharge Instructions (Signed)

## 2022-01-17 ENCOUNTER — Encounter: Payer: Self-pay | Admitting: Ophthalmology

## 2022-01-17 ENCOUNTER — Ambulatory Visit: Payer: Medicare PPO | Admitting: Anesthesiology

## 2022-01-17 ENCOUNTER — Other Ambulatory Visit: Payer: Self-pay

## 2022-01-17 ENCOUNTER — Encounter
Admission: RE | Disposition: A | Discharge status: Home / Self Care | Payer: Self-pay | Source: Home / Self Care | Attending: Ophthalmology

## 2022-01-17 ENCOUNTER — Ambulatory Visit
Admission: RE | Admit: 2022-01-17 | Discharge: 2022-01-17 | Disposition: A | Discharge status: Home / Self Care | Payer: Medicare PPO | Attending: Ophthalmology | Admitting: Ophthalmology

## 2022-01-17 DIAGNOSIS — I272 Pulmonary hypertension, unspecified: Secondary | ICD-10-CM | POA: Diagnosis not present

## 2022-01-17 DIAGNOSIS — I110 Hypertensive heart disease with heart failure: Secondary | ICD-10-CM | POA: Diagnosis not present

## 2022-01-17 DIAGNOSIS — H2512 Age-related nuclear cataract, left eye: Secondary | ICD-10-CM | POA: Insufficient documentation

## 2022-01-17 DIAGNOSIS — E785 Hyperlipidemia, unspecified: Secondary | ICD-10-CM | POA: Insufficient documentation

## 2022-01-17 DIAGNOSIS — H42 Glaucoma in diseases classified elsewhere: Secondary | ICD-10-CM | POA: Insufficient documentation

## 2022-01-17 DIAGNOSIS — E1139 Type 2 diabetes mellitus with other diabetic ophthalmic complication: Secondary | ICD-10-CM | POA: Diagnosis not present

## 2022-01-17 DIAGNOSIS — H401123 Primary open-angle glaucoma, left eye, severe stage: Secondary | ICD-10-CM | POA: Diagnosis not present

## 2022-01-17 DIAGNOSIS — I509 Heart failure, unspecified: Secondary | ICD-10-CM | POA: Insufficient documentation

## 2022-01-17 DIAGNOSIS — I252 Old myocardial infarction: Secondary | ICD-10-CM | POA: Insufficient documentation

## 2022-01-17 DIAGNOSIS — Z87891 Personal history of nicotine dependence: Secondary | ICD-10-CM | POA: Diagnosis not present

## 2022-01-17 DIAGNOSIS — I4510 Unspecified right bundle-branch block: Secondary | ICD-10-CM | POA: Diagnosis not present

## 2022-01-17 DIAGNOSIS — D649 Anemia, unspecified: Secondary | ICD-10-CM | POA: Diagnosis not present

## 2022-01-17 DIAGNOSIS — I2510 Atherosclerotic heart disease of native coronary artery without angina pectoris: Secondary | ICD-10-CM | POA: Diagnosis not present

## 2022-01-17 DIAGNOSIS — J449 Chronic obstructive pulmonary disease, unspecified: Secondary | ICD-10-CM | POA: Insufficient documentation

## 2022-01-17 DIAGNOSIS — K219 Gastro-esophageal reflux disease without esophagitis: Secondary | ICD-10-CM | POA: Diagnosis not present

## 2022-01-17 DIAGNOSIS — R0609 Other forms of dyspnea: Secondary | ICD-10-CM | POA: Insufficient documentation

## 2022-01-17 DIAGNOSIS — E1136 Type 2 diabetes mellitus with diabetic cataract: Secondary | ICD-10-CM | POA: Insufficient documentation

## 2022-01-17 HISTORY — PX: CATARACT EXTRACTION W/PHACO: SHX586

## 2022-01-17 HISTORY — DX: Presence of dental prosthetic device (complete) (partial): Z97.2

## 2022-01-17 SURGERY — PHACOEMULSIFICATION, CATARACT, WITH IOL INSERTION
Anesthesia: Monitor Anesthesia Care | Site: Eye | Laterality: Left

## 2022-01-17 MED ORDER — ARMC OPHTHALMIC DILATING DROPS
1.0000 "application " | OPHTHALMIC | Status: DC | PRN
  Administered 2022-01-17 (×2): 1 via OPHTHALMIC

## 2022-01-17 MED ORDER — LACTATED RINGERS IV SOLN: INTRAVENOUS | Status: DC

## 2022-01-17 MED ORDER — TETRACAINE HCL 0.5 % OP SOLN
1.0000 [drp] | OPHTHALMIC | Status: DC | PRN
  Administered 2022-01-17 (×3): 1 [drp] via OPHTHALMIC

## 2022-01-17 MED ORDER — SIGHTPATH DOSE#1 BSS IO SOLN
INTRAOCULAR | Status: DC | PRN
  Administered 2022-01-17: 1 mL via INTRAMUSCULAR

## 2022-01-17 MED ORDER — TRYPAN BLUE 0.06 % IO SOSY
PREFILLED_SYRINGE | INTRAOCULAR | Status: DC | PRN
Start: 2022-01-17 — End: 2022-01-17
  Administered 2022-01-17: 0.5 mL via INTRAOCULAR

## 2022-01-17 MED ORDER — SIGHTPATH DOSE#1 BSS IO SOLN
INTRAOCULAR | Status: DC | PRN
  Administered 2022-01-17: 15 mL

## 2022-01-17 MED ORDER — SIGHTPATH DOSE#1 NA HYALUR & NA CHOND-NA HYALUR IO KIT
PACK | INTRAOCULAR | Status: DC | PRN
  Administered 2022-01-17: 1 via OPHTHALMIC

## 2022-01-17 MED ORDER — FENTANYL CITRATE (PF) 100 MCG/2ML IJ SOLN
INTRAMUSCULAR | Status: DC | PRN
  Administered 2022-01-17: 50 ug via INTRAVENOUS

## 2022-01-17 MED ORDER — MOXIFLOXACIN HCL 0.5 % OP SOLN
OPHTHALMIC | Status: DC | PRN
  Administered 2022-01-17: 0.2 mL via OPHTHALMIC

## 2022-01-17 MED ORDER — ONDANSETRON HCL 4 MG/2ML IJ SOLN: 4.0000 mg | Freq: Once | INTRAMUSCULAR | Status: DC | PRN

## 2022-01-17 MED ORDER — ACETAMINOPHEN 10 MG/ML IV SOLN: 1000.0000 mg | Freq: Once | INTRAVENOUS | Status: DC | PRN

## 2022-01-17 SURGICAL SUPPLY — 16 items
CANNULA ANT/CHMB 27G (MISCELLANEOUS) IMPLANT
CANNULA ANT/CHMB 27GA (MISCELLANEOUS) ×2 IMPLANT
CATARACT SUITE SIGHTPATH (MISCELLANEOUS) ×2 IMPLANT
FEE CATARACT SUITE SIGHTPATH (MISCELLANEOUS) ×1 IMPLANT
GLOVE SRG 8 PF TXTR STRL LF DI (GLOVE) ×1 IMPLANT
GLOVE SURG ENC TEXT LTX SZ7.5 (GLOVE) ×2 IMPLANT
GLOVE SURG UNDER POLY LF SZ8 (GLOVE) ×2
ICLIP (OPHTHALMIC RELATED) ×1 IMPLANT
LENS IOL DIOP 19.0 (Intraocular Lens) ×2 IMPLANT
LENS IOL TECNIS MONO 19.0 (Intraocular Lens) IMPLANT
NDL FILTER BLUNT 18X1 1/2 (NEEDLE) ×1 IMPLANT
NEEDLE FILTER BLUNT 18X 1/2SAF (NEEDLE) ×1
NEEDLE FILTER BLUNT 18X1 1/2 (NEEDLE) ×1 IMPLANT
SYR 3ML LL SCALE MARK (SYRINGE) ×2 IMPLANT
WATER STERILE IRR 250ML POUR (IV SOLUTION) ×2 IMPLANT
WICK EYE OCUCEL (MISCELLANEOUS) ×1 IMPLANT

## 2022-01-17 NOTE — Anesthesia Preprocedure Evaluation (Signed)
Anesthesia Evaluation  ?Patient identified by MRN, date of birth, ID band ?Patient awake ? ? ? ?Reviewed: ?Allergy & Precautions, NPO status , Patient's Chart, lab work & pertinent test results, reviewed documented beta blocker date and time  ? ?History of Anesthesia Complications ?Negative for: history of anesthetic complications ? ?Airway ?Mallampati: IV ? ?TM Distance: >3 FB ?Neck ROM: Limited ? ?Mouth opening: Limited Mouth Opening ? Dental ? ?(+) Partial Upper ?  ?Pulmonary ?COPD, former smoker,  ? ?Pulm HTN ?  ?breath sounds clear to auscultation ? ? ? ? ? ? Cardiovascular ?Exercise Tolerance: Poor ?hypertension, (-) angina+ CAD, + Past MI, + Cardiac Stents (1983), +CHF and + DOE (Chronic, stable)  ?+ dysrhythmias (RBBB)  ?Rhythm:Regular Rate:Normal ? ? ?HLD ?  ?Neuro/Psych ?  ? GI/Hepatic ?GERD  ,  ?Endo/Other  ?diabetes, Type 2 ? Renal/GU ?  ? ?  ?Musculoskeletal ? ? Abdominal ?  ?Peds ? Hematology ? ?(+) Blood dyscrasia, anemia ,   ?Anesthesia Other Findings ?Skin cancer ? Reproductive/Obstetrics ? ?  ? ? ? ? ? ? ? ? ? ? ? ? ? ?  ?  ? ? ? ? ? ? ? ? ?Anesthesia Physical ?Anesthesia Plan ? ?ASA: 3 ? ?Anesthesia Plan: MAC  ? ?Post-op Pain Management:   ? ?Induction: Intravenous ? ?PONV Risk Score and Plan: 1 and TIVA, Midazolam and Treatment may vary due to age or medical condition ? ?Airway Management Planned: Nasal Cannula ? ?Additional Equipment:  ? ?Intra-op Plan:  ? ?Post-operative Plan:  ? ?Informed Consent: I have reviewed the patients History and Physical, chart, labs and discussed the procedure including the risks, benefits and alternatives for the proposed anesthesia with the patient or authorized representative who has indicated his/her understanding and acceptance.  ? ? ? ? ? ?Plan Discussed with: CRNA and Anesthesiologist ? ?Anesthesia Plan Comments:   ? ? ? ? ? ? ?Anesthesia Quick Evaluation ? ?

## 2022-01-17 NOTE — Op Note (Signed)
PREOPERATIVE DIAGNOSIS:  Nuclear sclerotic cataract left eye. H25.12 ? 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 ?POSTOPERATIVE DIAGNOSIS:    Nuclear sclerotic cataract left eye.   ?  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 ?PROCEDURE:  Phacoemusification with posterior chamber intraocular lens placement of the left eye  ?[place] Dual Blade goniotomy left eye ? Ultrasound time: Procedure(s) with comments: ?CATARACT EXTRACTION PHACO AND INTRAOCULAR LENS PLACEMENT (IOC) LEFT MALYUGIN VISION BLUE [place] DUAL BLADE GONIOTOMY (Left) - 13.73 ?01:56.5 ? ?LENS:  ?Implant Name Type Inv. Item Serial No. Manufacturer Lot No. LRB No. Used Action  ?LENS IOL DIOP 19.0 - [id number] Intraocular Lens LENS IOL DIOP 19.0 [id number] [employer]  Left 1 Implanted  ? ? ?SURGEON:  Wyonia Hough, MD ?  ?ANESTHESIA:  Topical with tetracaine drops augmented with 1% preservative-free intracameral lidocaine. ? ?  ?COMPLICATIONS:  None. ?  ?DESCRIPTION OF PROCEDURE:  The patient was identified in the holding room and transported to the operating room and placed in the supine position under the operating microscope.  The left eye was identified as the operative eye and it was prepped and draped in the usual sterile ophthalmic fashion. ?  ?A 1 millimeter clear-corneal paracentesis was made at the 5:30 position.  0.5 ml of preservative-free 1% lidocaine was injected into the anterior chamber. Vision blue dye was placed into the anterior chamber. ? The anterior chamber was filled with Viscoat viscoelastic.  A 2.4 millimeter keratome was used to make a near-clear corneal incision at the 2:30 position. The microscope was adjusted and a gonioprism was used to visulaize the trabecular meshwork.  The [facility] Dual Blade was advanced across the anterior chamber under viscoelastic.  The blade was used to mark the trabecular meshwork at the 7:30 position.  The blade was placed two clock hours clockwise into the  meshwork.  Proper postioning was confirmed.  The blade ws passed counterclockwise through the meshwork to excise approximately two to three clock-hours of trabecular meshwork. ? ? A curvilinear capsulorrhexis was made with a cystotome and capsulorrhexis forceps.  Balanced salt solution was used to hydrodissect and hydrodelineate the nucleus. ?  ?Phacoemulsification was then used in stop and chop fashion to remove the lens nucleus and epinucleus.  The remaining cortex was then removed using the irrigation and aspiration handpiece. Provisc was then placed into the capsular bag to distend it for lens placement.  A lens was then injected into the capsular bag.  The remaining viscoelastic was aspirated. ?  ?Wounds were hydrated with balanced salt solution.  The anterior chamber was inflated to a physiologic pressure with balanced salt solution.  No wound leaks were noted. Vigamox 0.2 ml of a 1mg  per ml solution was injected into the anterior chamber for a dose of 0.2 mg of intracameral antibiotic at the completion of the case. ? ?The patient was taken to the recovery room in stable condition without complications of anesthesia or surgery. ? ?

## 2022-01-17 NOTE — Transfer of Care (Signed)
Immediate Anesthesia Transfer of Care Note ? ?Patient: Derek Haney ? ?Procedure(s) Performed: CATARACT EXTRACTION PHACO AND INTRAOCULAR LENS PLACEMENT (IOC) LEFT MALYUGIN VISION BLUE [place] DUAL BLADE GONIOTOMY (Left: Eye) ? ?Patient Location: PACU ? ?Anesthesia Type: MAC ? ?Level of Consciousness: awake, alert  and patient cooperative ? ?Airway and Oxygen Therapy: Patient Spontanous Breathing and Patient connected to supplemental oxygen ? ?Post-op Assessment: Post-op Vital signs reviewed, Patient's Cardiovascular Status Stable, Respiratory Function Stable, Patent Airway and No signs of Nausea or vomiting ? ?Post-op Vital Signs: Reviewed and stable ? ?Complications: No notable events documented. ? ?

## 2022-01-17 NOTE — Anesthesia Postprocedure Evaluation (Signed)
Anesthesia Post Note ? ?Patient: Derek Haney ? ?Procedure(s) Performed: CATARACT EXTRACTION PHACO AND INTRAOCULAR LENS PLACEMENT (IOC) LEFT MALYUGIN VISION BLUE [place] DUAL BLADE GONIOTOMY (Left: Eye) ? ? ?  ?Patient location during evaluation: PACU ?Anesthesia Type: MAC ?Level of consciousness: awake and alert ?Pain management: pain level controlled ?Vital Signs Assessment: post-procedure vital signs reviewed and stable ?Respiratory status: spontaneous breathing, nonlabored ventilation, respiratory function stable and patient connected to nasal cannula oxygen ?Cardiovascular status: stable and blood pressure returned to baseline ?Postop Assessment: no apparent nausea or vomiting ?Anesthetic complications: no ? ? ?No notable events documented. ? ?Willia Lampert A  Terie Lear ? ? ? ? ? ?

## 2022-01-17 NOTE — H&P (Signed)
?[facility]  ? ?Primary Care Physician:  Rusty Aus, MD ?Ophthalmologist: Dr. Leandrew Koyanagi ? ?Pre-Procedure History & Physical: ?HPI:  Derek Haney is a 88 y.o. male here for ophthalmic surgery. ?  ?Past Medical History:  ?Diagnosis Date  ? Anemia   ? Cancer [facility])   ? basal cell  ? CHF (congestive heart failure) ([place])   ? Coronary artery disease   ? Dysrhythmia   ? GERD (gastroesophageal reflux disease)   ? Glaucoma (increased eye pressure)   ? Hypertension   ? Moderate COPD (chronic obstructive pulmonary disease) ([place]) 06/19/2018  ? Myocardial infarction [facility])   ? Wears dentures   ? partial upper  ? ? ?Past Surgical History:  ?Procedure Laterality Date  ? CORONARY ANGIOPLASTY    ? TONSILLECTOMY    ? TOTAL HIP ARTHROPLASTY Right 03/21/2016  ? Procedure: TOTAL HIP ARTHROPLASTY;  Surgeon: [name], MD;  Location: [facility] ORS;  Service: Orthopedics;  Laterality: Right;  ? ? ?Prior to Admission medications   ?Medication Sig Start Date End Date Taking? Authorizing Provider  ?ASPIRIN 81 PO Take by mouth daily.   Yes [provider]  ?atorvastatin (LIPITOR) 20 MG tablet Take 20 mg by mouth daily at 6 PM.   Yes [provider]  ?bimatoprost (LUMIGAN) 0.01 % SOLN Place 1 drop into both eyes at bedtime.   Yes [provider]  ?cycloSPORINE (RESTASIS) 0.05 % ophthalmic emulsion Place 1 drop into both eyes 2 (two) times daily.   Yes [provider]  ?dorzolamide (TRUSOPT) 2 % ophthalmic solution Place 1 drop into both eyes 2 (two) times daily.   Yes [provider]  ?ferrous sulfate 325 (65 FE) MG tablet Take 325 mg by mouth daily with breakfast.   Yes [provider]  ?ISOSORBIDE MONONITRATE ER PO Take 30 mg by mouth 3 (three) times daily.   Yes [provider]  ?meloxicam (MOBIC) 7.5 MG tablet Take 7.5 mg by mouth daily.   Yes [provider]  ?pantoprazole (PROTONIX) 40 MG tablet Take 40 mg by mouth daily.   Yes [provider]  ?Polyethyl Glycol-Propyl Glycol (SYSTANE FREE OP) Apply 2 drops to eye in the morning and at bedtime.   Yes [provider]  ?potassium chloride SA (K-DUR,KLOR-CON) 20 MEQ tablet Take 20 mEq by mouth daily.   Yes [provider]  ?propranolol (INDERAL) 20 MG tablet Take 20 mg by mouth 4 (four) times daily.   Yes [provider]  ?traZODone (DESYREL) 50 MG tablet Take 100 mg by mouth at bedtime.   Yes [provider]  ?Donnal Debar 100-62.5-25 MCG/INH AEPB INHALE 1 PUFFS BY MOUTH ONCE DAILY 04/08/19  Yes Wilhelmina Mcardle, MD  ?albuterol (PROVENTIL HFA;VENTOLIN HFA) 108 (90 Base) MCG/ACT inhaler Inhale 1-2 puffs into the lungs every 6 (six) hours as needed for wheezing or shortness of breath. 06/03/18   Wilhelmina Mcardle, MD  ?furosemide (LASIX) 20 MG tablet Take 20 mg by mouth daily. ?Patient not taking: Reported on 01/10/2022    [provider]  ? ? ?Allergies as of 12/19/2021 - Review Complete 05/11/2021  ?Allergen Reaction Noted  ? Aspirin Other (See Comments) 06/10/2012  ? Penicillins Rash 03/07/2016  ? ? ?History reviewed. No pertinent family history. ? ?Social History  ? ?Socioeconomic History  ? Marital status: Married  ?  Spouse name: Not on file  ? Number of children: Not on file  ? Years of education: Not on file  ?  Highest education level: Not on file  ?Occupational History  ? Not on file  ?Tobacco Use  ? Smoking status: Former  ?  Packs/day: 1.00  ?  Years: 15.00  ?  Pack years: 15.00  ?  Types: Cigarettes  ?  Quit date: 2000  ?  Years since quitting: 23.1  ? Smokeless tobacco: Former  ?  Types: Chew  ?  Quit date: 2000  ? Tobacco comments:  ?  it's been over 20 since he quit  ?Vaping Use  ? Vaping Use: Never used  ?Substance and Sexual Activity  ? Alcohol use: Not Currently  ?  Comment: occ  ? Drug use: No  ? Sexual activity: Not on file  ?Other Topics Concern  ? Not on file  ?Social History Narrative  ? Not on file  ? ?Social Determinants of  Health  ? ?Financial Resource Strain: Not on file  ?Food Insecurity: Not on file  ?Transportation Needs: Not on file  ?Physical Activity: Not on file  ?Stress: Not on file  ?Social Connections: Not on file  ?Intimate Partner Violence: Not on file  ? ? ?Review of Systems: ?See HPI, otherwise negative ROS ? ?Physical Exam: ?BP 139/72   Pulse (!) 59   Temp 98 ?F (36.7 ?C) (Temporal)   Ht 5\' 10"  (1.778 m)   Wt 86.2 kg   SpO2 96%   BMI 27.28 kg/m?  ?General:   Alert,  pleasant and cooperative in NAD ?Head:  Normocephalic and atraumatic. ?Lungs:  Clear to auscultation.    ?Heart:  Regular rate and rhythm.  ? ?Impression/Plan: ?Derek Haney is here for ophthalmic surgery. ? ?Risks, benefits, limitations, and alternatives regarding ophthalmic surgery have been reviewed with the patient.  Questions have been answered.  All parties agreeable. ? ? Leandrew Koyanagi, MD  01/17/2022, 7:33 AM ? ?

## 2022-01-18 ENCOUNTER — Encounter: Payer: Self-pay | Admitting: Ophthalmology

## 2022-11-21 ENCOUNTER — Other Ambulatory Visit (HOSPITAL_COMMUNITY): Payer: Self-pay | Admitting: Internal Medicine

## 2022-11-21 ENCOUNTER — Ambulatory Visit
Admission: RE | Admit: 2022-11-21 | Discharge: 2022-11-21 | Disposition: A | Discharge status: Home / Self Care | Payer: Medicare PPO | Source: Ambulatory Visit | Attending: Internal Medicine | Admitting: Internal Medicine

## 2022-11-21 DIAGNOSIS — R0609 Other forms of dyspnea: Secondary | ICD-10-CM

## 2022-11-21 DIAGNOSIS — J181 Lobar pneumonia, unspecified organism: Secondary | ICD-10-CM | POA: Insufficient documentation

## 2022-11-21 LAB — POCT I-STAT CREATININE: Creatinine, Ser: 0.8 mg/dL (ref 0.61–1.24)

## 2022-11-21 MED ORDER — IOHEXOL 350 MG/ML SOLN
75.0000 mL | Freq: Once | INTRAVENOUS | Status: AC | PRN
  Administered 2022-11-21: 75 mL via INTRAVENOUS

## 2022-12-29 ENCOUNTER — Other Ambulatory Visit: Payer: Self-pay

## 2022-12-29 ENCOUNTER — Encounter: Payer: Self-pay | Admitting: Family Medicine

## 2022-12-29 ENCOUNTER — Observation Stay
Admission: EM | Admit: 2022-12-29 | Discharge: 2022-12-30 | Disposition: A | Discharge status: Home / Self Care | Payer: Medicare PPO | Attending: Internal Medicine | Admitting: Internal Medicine

## 2022-12-29 ENCOUNTER — Emergency Department: Payer: Medicare PPO

## 2022-12-29 DIAGNOSIS — Z7982 Long term (current) use of aspirin: Secondary | ICD-10-CM | POA: Diagnosis not present

## 2022-12-29 DIAGNOSIS — X58XXXA Exposure to other specified factors, initial encounter: Secondary | ICD-10-CM | POA: Insufficient documentation

## 2022-12-29 DIAGNOSIS — Z79899 Other long term (current) drug therapy: Secondary | ICD-10-CM | POA: Diagnosis not present

## 2022-12-29 DIAGNOSIS — K635 Polyp of colon: Secondary | ICD-10-CM

## 2022-12-29 DIAGNOSIS — D61818 Other pancytopenia: Secondary | ICD-10-CM | POA: Diagnosis not present

## 2022-12-29 DIAGNOSIS — D519 Vitamin B12 deficiency anemia, unspecified: Secondary | ICD-10-CM | POA: Diagnosis not present

## 2022-12-29 DIAGNOSIS — D125 Benign neoplasm of sigmoid colon: Secondary | ICD-10-CM | POA: Insufficient documentation

## 2022-12-29 DIAGNOSIS — Z96641 Presence of right artificial hip joint: Secondary | ICD-10-CM | POA: Diagnosis not present

## 2022-12-29 DIAGNOSIS — I110 Hypertensive heart disease with heart failure: Secondary | ICD-10-CM | POA: Insufficient documentation

## 2022-12-29 DIAGNOSIS — D124 Benign neoplasm of descending colon: Secondary | ICD-10-CM | POA: Diagnosis not present

## 2022-12-29 DIAGNOSIS — R0789 Other chest pain: Secondary | ICD-10-CM | POA: Insufficient documentation

## 2022-12-29 DIAGNOSIS — R0602 Shortness of breath: Secondary | ICD-10-CM | POA: Diagnosis present

## 2022-12-29 DIAGNOSIS — Z87891 Personal history of nicotine dependence: Secondary | ICD-10-CM | POA: Diagnosis not present

## 2022-12-29 DIAGNOSIS — D62 Acute posthemorrhagic anemia: Secondary | ICD-10-CM | POA: Insufficient documentation

## 2022-12-29 DIAGNOSIS — S27818A Other injury of esophagus (thoracic part), initial encounter: Secondary | ICD-10-CM | POA: Diagnosis not present

## 2022-12-29 DIAGNOSIS — K223 Perforation of esophagus: Secondary | ICD-10-CM | POA: Diagnosis not present

## 2022-12-29 DIAGNOSIS — K226 Gastro-esophageal laceration-hemorrhage syndrome: Secondary | ICD-10-CM

## 2022-12-29 DIAGNOSIS — I10 Essential (primary) hypertension: Secondary | ICD-10-CM | POA: Diagnosis present

## 2022-12-29 DIAGNOSIS — K5732 Diverticulitis of large intestine without perforation or abscess without bleeding: Secondary | ICD-10-CM | POA: Insufficient documentation

## 2022-12-29 DIAGNOSIS — E785 Hyperlipidemia, unspecified: Secondary | ICD-10-CM | POA: Diagnosis present

## 2022-12-29 DIAGNOSIS — D649 Anemia, unspecified: Secondary | ICD-10-CM | POA: Diagnosis not present

## 2022-12-29 DIAGNOSIS — D123 Benign neoplasm of transverse colon: Secondary | ICD-10-CM | POA: Insufficient documentation

## 2022-12-29 DIAGNOSIS — I5032 Chronic diastolic (congestive) heart failure: Secondary | ICD-10-CM | POA: Diagnosis not present

## 2022-12-29 DIAGNOSIS — I2510 Atherosclerotic heart disease of native coronary artery without angina pectoris: Secondary | ICD-10-CM | POA: Diagnosis present

## 2022-12-29 DIAGNOSIS — K219 Gastro-esophageal reflux disease without esophagitis: Secondary | ICD-10-CM | POA: Diagnosis present

## 2022-12-29 DIAGNOSIS — J449 Chronic obstructive pulmonary disease, unspecified: Secondary | ICD-10-CM | POA: Diagnosis present

## 2022-12-29 LAB — BASIC METABOLIC PANEL
Anion gap: 7 (ref 5–15)
BUN: 26 mg/dL — ABNORMAL HIGH (ref 8–23)
CO2: 23 mmol/L (ref 22–32)
Calcium: 8.6 mg/dL — ABNORMAL LOW (ref 8.9–10.3)
Chloride: 103 mmol/L (ref 98–111)
Creatinine, Ser: 0.83 mg/dL (ref 0.61–1.24)
GFR, Estimated: >60 mL/min (ref >60)
Glucose, Bld: 138 mg/dL — ABNORMAL HIGH (ref 70–99)
Potassium: 4.2 mmol/L (ref 3.5–5.1)
Sodium: 133 mmol/L — ABNORMAL LOW (ref 135–145)

## 2022-12-29 LAB — PROTIME-INR
INR: 1.2 (ref 0.8–1.2)
Prothrombin Time: 14.8 seconds (ref 11.4–15.2)

## 2022-12-29 LAB — IRON AND TIBC
Iron: 120 ug/dL (ref 45–182)
Saturation Ratios: 37 % (ref 17.9–39.5)
TIBC: 325 ug/dL (ref 250–450)
UIBC: 205 ug/dL

## 2022-12-29 LAB — CBC WITH DIFFERENTIAL/PLATELET
Abs Immature Granulocytes: 0.15 10*3/uL — ABNORMAL HIGH (ref 0.00–0.07)
Basophils Absolute: 0 10*3/uL (ref 0.0–0.1)
Basophils Relative: 0 %
Eosinophils Absolute: 0 10*3/uL (ref 0.0–0.5)
Eosinophils Relative: 1 %
HCT: 16.4 % — ABNORMAL LOW (ref 39.0–52.0)
Hemoglobin: 5.4 g/dL — ABNORMAL LOW (ref 13.0–17.0)
Immature Granulocytes: 5 %
Lymphocytes Relative: 21 %
Lymphs Abs: 0.7 10*3/uL (ref 0.7–4.0)
MCH: 34.6 pg — ABNORMAL HIGH (ref 26.0–34.0)
MCHC: 32.9 g/dL (ref 30.0–36.0)
MCV: 105.1 fL — ABNORMAL HIGH (ref 80.0–100.0)
Monocytes Absolute: 0.9 10*3/uL (ref 0.1–1.0)
Monocytes Relative: 27 %
Neutro Abs: 1.5 10*3/uL — ABNORMAL LOW (ref 1.7–7.7)
Neutrophils Relative %: 46 %
Platelets: 127 10*3/uL — ABNORMAL LOW (ref 150–400)
RBC: 1.56 MIL/uL — ABNORMAL LOW (ref 4.22–5.81)
RDW: 17.2 % — ABNORMAL HIGH (ref 11.5–15.5)
WBC: 3.3 10*3/uL — ABNORMAL LOW (ref 4.0–10.5)
nRBC: 2.4 % — ABNORMAL HIGH (ref 0.0–0.2)

## 2022-12-29 LAB — COMPREHENSIVE METABOLIC PANEL
ALT: 16 U/L (ref 0–44)
AST: 17 U/L (ref 15–41)
Albumin: 3.6 g/dL (ref 3.5–5.0)
Alkaline Phosphatase: 77 U/L (ref 38–126)
Anion gap: 8 (ref 5–15)
BUN: 27 mg/dL — ABNORMAL HIGH (ref 8–23)
CO2: 24 mmol/L (ref 22–32)
Calcium: 9 mg/dL (ref 8.9–10.3)
Chloride: 100 mmol/L (ref 98–111)
Creatinine, Ser: 0.83 mg/dL (ref 0.61–1.24)
GFR, Estimated: >60 mL/min (ref >60)
Glucose, Bld: 156 mg/dL — ABNORMAL HIGH (ref 70–99)
Potassium: 4.1 mmol/L (ref 3.5–5.1)
Sodium: 132 mmol/L — ABNORMAL LOW (ref 135–145)
Total Bilirubin: 1 mg/dL (ref 0.3–1.2)
Total Protein: 7 g/dL (ref 6.5–8.1)

## 2022-12-29 LAB — CBC
HCT: 15 % — ABNORMAL LOW (ref 39.0–52.0)
Hemoglobin: 5 g/dL — ABNORMAL LOW (ref 13.0–17.0)
MCH: 34.2 pg — ABNORMAL HIGH (ref 26.0–34.0)
MCHC: 33.3 g/dL (ref 30.0–36.0)
MCV: 102.7 fL — ABNORMAL HIGH (ref 80.0–100.0)
Platelets: 102 10*3/uL — ABNORMAL LOW (ref 150–400)
RBC: 1.46 MIL/uL — ABNORMAL LOW (ref 4.22–5.81)
RDW: 17.3 % — ABNORMAL HIGH (ref 11.5–15.5)
WBC: 2.9 10*3/uL — ABNORMAL LOW (ref 4.0–10.5)
nRBC: 2.4 % — ABNORMAL HIGH (ref 0.0–0.2)

## 2022-12-29 LAB — APTT: aPTT: 27 seconds (ref 24–36)

## 2022-12-29 LAB — TROPONIN I (HIGH SENSITIVITY)
Troponin I (High Sensitivity): 10 ng/L (ref <18)
Troponin I (High Sensitivity): 11 ng/L (ref <18)

## 2022-12-29 LAB — RETICULOCYTES
Immature Retic Fract: 33.9 % — ABNORMAL HIGH (ref 2.3–15.9)
RBC.: 1.46 MIL/uL — ABNORMAL LOW (ref 4.22–5.81)
Retic Count, Absolute: 55.6 10*3/uL (ref 19.0–186.0)
Retic Ct Pct: 3.8 % — ABNORMAL HIGH (ref 0.4–3.1)

## 2022-12-29 LAB — FERRITIN: Ferritin: 99 ng/mL (ref 24–336)

## 2022-12-29 LAB — HEMOGLOBIN AND HEMATOCRIT, BLOOD
HCT: 21.5 % — ABNORMAL LOW (ref 39.0–52.0)
HCT: 20.9 % — ABNORMAL LOW (ref 39.0–52.0)
Hemoglobin: 7.5 g/dL — ABNORMAL LOW (ref 13.0–17.0)
Hemoglobin: 7.1 g/dL — ABNORMAL LOW (ref 13.0–17.0)

## 2022-12-29 LAB — BRAIN NATRIURETIC PEPTIDE: B Natriuretic Peptide: 47.4 pg/mL (ref 0.0–100.0)

## 2022-12-29 LAB — FOLATE: Folate: 9.2 ng/mL (ref >5.9)

## 2022-12-29 LAB — PREPARE RBC (CROSSMATCH)

## 2022-12-29 LAB — VITAMIN B12: Vitamin B-12: 248 pg/mL (ref 180–914)

## 2022-12-29 MED ORDER — PANTOPRAZOLE SODIUM 40 MG IV SOLR: 40.0000 mg | Freq: Two times a day (BID) | INTRAVENOUS | Status: DC

## 2022-12-29 MED ORDER — DORZOLAMIDE HCL 2 % OP SOLN
1.0000 [drp] | Freq: Two times a day (BID) | OPHTHALMIC | Status: DC
  Administered 2022-12-29: 1 [drp] via OPHTHALMIC
  Filled 2022-12-29: qty 10

## 2022-12-29 MED ORDER — ISOSORBIDE MONONITRATE ER 30 MG PO TB24
30.0000 mg | ORAL_TABLET | Freq: Three times a day (TID) | ORAL | Status: DC
  Administered 2022-12-29 – 2022-12-30 (×3): 30 mg via ORAL
  Filled 2022-12-29 (×4): qty 1

## 2022-12-29 MED ORDER — PEG 3350-KCL-NA BICARB-NACL 420 G PO SOLR
4000.0000 mL | Freq: Once | ORAL | Status: AC
  Administered 2022-12-29: 4000 mL via ORAL
  Filled 2022-12-29 (×2): qty 4000

## 2022-12-29 MED ORDER — ACETAMINOPHEN 650 MG RE SUPP: 650.0000 mg | Freq: Four times a day (QID) | RECTAL | Status: DC | PRN

## 2022-12-29 MED ORDER — ACETAMINOPHEN 325 MG PO TABS: 650.0000 mg | ORAL_TABLET | Freq: Four times a day (QID) | ORAL | Status: DC | PRN

## 2022-12-29 MED ORDER — POTASSIUM CHLORIDE CRYS ER 20 MEQ PO TBCR: 20.0000 meq | EXTENDED_RELEASE_TABLET | Freq: Every day | ORAL | Status: DC

## 2022-12-29 MED ORDER — ALBUTEROL SULFATE (2.5 MG/3ML) 0.083% IN NEBU: 3.0000 mL | INHALATION_SOLUTION | RESPIRATORY_TRACT | Status: DC | PRN

## 2022-12-29 MED ORDER — TRAZODONE HCL 100 MG PO TABS
100.0000 mg | ORAL_TABLET | Freq: Every day | ORAL | Status: DC
  Administered 2022-12-29: 100 mg via ORAL
  Filled 2022-12-29: qty 1

## 2022-12-29 MED ORDER — LATANOPROST 0.005 % OP SOLN
1.0000 [drp] | Freq: Every day | OPHTHALMIC | Status: DC
  Administered 2022-12-29: 1 [drp] via OPHTHALMIC
  Filled 2022-12-29: qty 2.5

## 2022-12-29 MED ORDER — PANTOPRAZOLE INFUSION (NEW) - SIMPLE MED
8.0000 mg/h | INTRAVENOUS | Status: DC
  Administered 2022-12-29: 8 mg/h via INTRAVENOUS
  Filled 2022-12-29 (×2): qty 100

## 2022-12-29 MED ORDER — ONDANSETRON HCL 4 MG/2ML IJ SOLN: 4.0000 mg | Freq: Four times a day (QID) | INTRAMUSCULAR | Status: DC | PRN

## 2022-12-29 MED ORDER — HYDRALAZINE HCL 20 MG/ML IJ SOLN: 5.0000 mg | INTRAMUSCULAR | Status: DC | PRN

## 2022-12-29 MED ORDER — SODIUM CHLORIDE 0.9 % IV SOLN: INTRAVENOUS | Status: DC

## 2022-12-29 MED ORDER — PANTOPRAZOLE 80MG IVPB - SIMPLE MED
80.0000 mg | Freq: Once | INTRAVENOUS | Status: AC
  Administered 2022-12-29: 80 mg via INTRAVENOUS
  Filled 2022-12-29: qty 100

## 2022-12-29 MED ORDER — SUCRALFATE 1 G PO TABS
1.0000 g | ORAL_TABLET | Freq: Four times a day (QID) | ORAL | Status: DC
  Administered 2022-12-29 – 2022-12-30 (×5): 1 g via ORAL
  Filled 2022-12-29 (×5): qty 1

## 2022-12-29 MED ORDER — FLUTICASONE FUROATE-VILANTEROL 100-25 MCG/ACT IN AEPB
1.0000 | INHALATION_SPRAY | Freq: Every day | RESPIRATORY_TRACT | Status: DC
  Administered 2022-12-29 – 2022-12-30 (×2): 1 via RESPIRATORY_TRACT
  Filled 2022-12-29: qty 28

## 2022-12-29 MED ORDER — TRAZODONE HCL 50 MG PO TABS: 25.0000 mg | ORAL_TABLET | Freq: Every evening | ORAL | Status: DC | PRN

## 2022-12-29 MED ORDER — ONDANSETRON HCL 4 MG PO TABS: 4.0000 mg | ORAL_TABLET | Freq: Four times a day (QID) | ORAL | Status: DC | PRN

## 2022-12-29 MED ORDER — SODIUM CHLORIDE 0.9 % IV SOLN: 10.0000 mL/h | Freq: Once | INTRAVENOUS | Status: DC

## 2022-12-29 MED ORDER — FERROUS GLUCONATE 324 (38 FE) MG PO TABS
324.0000 mg | ORAL_TABLET | Freq: Every morning | ORAL | Status: DC
  Administered 2022-12-29: 324 mg via ORAL
  Filled 2022-12-29: qty 1

## 2022-12-29 MED ORDER — PANTOPRAZOLE INFUSION (NEW) - SIMPLE MED
8.0000 mg/h | INTRAVENOUS | Status: DC
  Administered 2022-12-29 – 2022-12-30 (×3): 8 mg/h via INTRAVENOUS
  Filled 2022-12-29 (×3): qty 100

## 2022-12-29 MED ORDER — MAGNESIUM HYDROXIDE 400 MG/5ML PO SUSP: 30.0000 mL | Freq: Every day | ORAL | Status: DC | PRN

## 2022-12-29 MED ORDER — FERROUS SULFATE 325 (65 FE) MG PO TABS: 325.0000 mg | ORAL_TABLET | Freq: Every day | ORAL | Status: DC

## 2022-12-29 MED ORDER — DM-GUAIFENESIN ER 30-600 MG PO TB12: 1.0000 | ORAL_TABLET | Freq: Two times a day (BID) | ORAL | Status: DC | PRN

## 2022-12-29 MED ORDER — ATORVASTATIN CALCIUM 20 MG PO TABS
20.0000 mg | ORAL_TABLET | Freq: Every day | ORAL | Status: DC
  Administered 2022-12-29: 20 mg via ORAL
  Filled 2022-12-29: qty 1

## 2022-12-29 MED ORDER — PROPRANOLOL HCL 20 MG PO TABS
20.0000 mg | ORAL_TABLET | Freq: Four times a day (QID) | ORAL | Status: DC
  Administered 2022-12-29 – 2022-12-30 (×5): 20 mg via ORAL
  Filled 2022-12-29 (×5): qty 1

## 2022-12-29 NOTE — ED Provider Notes (Signed)
[facility] Provider Note    Event Date/Time   First MD Initiated Contact with Patient 12/29/22 0150     (approximate)   History   Chest Pain (Patient C/O mid-sternal chest pain that began intermittently over the last week, but woke him up out of his sleep tonight. Pain was relieved with sublingual nitro. Has history of MI in the past. )   HPI  Derek Haney is a 89 y.o. male who presents to the ED for evaluation of Chest Pain (Patient C/O mid-sternal chest pain that began intermittently over the last week, but woke him up out of his sleep tonight. Pain was relieved with sublingual nitro. Has history of MI in the past. )   I reviewed 1/31 PCP visit.  He was empirically given Levaquin earlier in the month for possible pneumonia but did not improve his dyspnea on exertion.  Looks like he had a hemoglobin drop on associated outpatient blood work from 12.6 (September) --> 8.4   Patient presents to the ED from home via EMS for evaluation of chest pain and dyspnea when exerting himself.  Reports progressive symptoms on a subacute timeframe but seemed worse tonight because he was feeling them at rest.  Symptoms have resolved by the time I see him and he reports feeling well with no complaints.  Asymptomatic now.  No abdominal pain, hematochezia, hematemesis or any other bleeding symptoms.  No hematuria.  He reports he had a small nosebleed a couple weeks ago but minimizes this.   Physical Exam   Triage Vital Signs: ED Triage Vitals  Enc Vitals Group     BP      Pulse      Resp      Temp      Temp src      SpO2      Weight      Height      Head Circumference      Peak Flow      Pain Score      Pain Loc      Pain Edu?      Excl. in [place]?     Most recent vital signs: Vitals:   12/29/22 0155 12/29/22 0200  BP: 138/63 (!) 129/56  Pulse: 78 76  Resp: 16 14  Temp: 97.8 F (36.6 C)   SpO2: 100% 100%    General: Awake, no distress.   Well-appearing CV:  Good peripheral perfusion.  Resp:  Normal effort.  Abd:  No distention.  Soft and benign MSK:  No deformity noted.  Neuro:  No focal deficits appreciated. Other:     ED Results / Procedures / Treatments   Labs (all labs ordered are listed, but only abnormal results are displayed) Labs Reviewed  COMPREHENSIVE METABOLIC PANEL - Abnormal; Notable for the following components:      Result Value   Sodium 132 (*)    Glucose, Bld 156 (*)    BUN 27 (*)    All other components within normal limits  CBC WITH DIFFERENTIAL/PLATELET - Abnormal; Notable for the following components:   WBC 3.3 (*)    RBC 1.56 (*)    Hemoglobin 5.4 (*)    HCT 16.4 (*)    MCV 105.1 (*)    MCH 34.6 (*)    RDW 17.2 (*)    Platelets 127 (*)    nRBC 2.4 (*)    Neutro Abs 1.5 (*)    Abs Immature Granulocytes 0.15 (*)  All other components within normal limits  PROTIME-INR  APTT  OCCULT BLOOD X 1 CARD TO LAB, STOOL  HEMOGLOBIN AND HEMATOCRIT, BLOOD  HEMOGLOBIN AND HEMATOCRIT, BLOOD  HEMOGLOBIN AND HEMATOCRIT, BLOOD  TYPE AND SCREEN  PREPARE RBC (CROSSMATCH)  TROPONIN I (HIGH SENSITIVITY)    EKG Poor quality EKG seems to demonstrate a sinus rhythm with a rate of 80 bpm.  Normal axis.  Right bundle.  Nonspecific ST changes without clear STEMI.  RADIOLOGY   Official radiology report(s): DG Chest Portable 1 View  Result Date: 12/29/2022 CLINICAL DATA:  chest pain/SOB EXAM: PORTABLE CHEST 1 VIEW COMPARISON:  Chest x-ray 05/11/2021, CT chest 11/21/2022 FINDINGS: The heart and mediastinal contours are unchanged. Aortic calcification. No focal consolidation. Chronic coarsened interstitial markings with no overt pulmonary edema. No pleural effusion. No pneumothorax. No acute osseous abnormality. IMPRESSION: 1. No active disease. 2. Aortic Atherosclerosis (ICD10-I70.0) and Emphysema (ICD10-J43.9). Electronically Signed   By: Iven Finn M.D.   On: 12/29/2022 02:28    PROCEDURES and  INTERVENTIONS:  .1-3 Lead EKG Interpretation  Performed by: Vladimir Crofts, MD Authorized by: Vladimir Crofts, MD     Interpretation: normal     ECG rate:  78   ECG rate assessment: normal     Rhythm: sinus rhythm     Ectopy: none     Conduction: normal   .Critical Care  Performed by: Vladimir Crofts, MD Authorized by: Vladimir Crofts, MD   Critical care provider statement:    Critical care time (minutes):  30   Critical care time was exclusive of:  Separately billable procedures and treating other patients   Critical care was necessary to treat or prevent imminent or life-threatening deterioration of the following conditions:  Circulatory failure   Critical care was time spent personally by me on the following activities:  Development of treatment plan with patient or surrogate, discussions with consultants, evaluation of patient's response to treatment, examination of patient, ordering and review of laboratory studies, ordering and review of radiographic studies, ordering and performing treatments and interventions, pulse oximetry, re-evaluation of patient's condition and review of old charts   Medications  0.9 %  sodium chloride infusion (has no administration in time range)  pantoprazole (PROTONIX) 80 mg /NS 100 mL IVPB (80 mg Intravenous New Bag/Given 12/29/22 0304)  pantoprozole (PROTONIX) 80 mg /NS 100 mL infusion (has no administration in time range)  pantoprazole (PROTONIX) injection 40 mg (has no administration in time range)     IMPRESSION / MDM / [place] / ED COURSE  I reviewed the triage vital signs and the nursing notes.  Differential diagnosis includes, but is not limited to, stable angina, symptomatic anemia, GI losses, ACS  {Patient presents with symptoms of an acute illness or injury that is potentially life-threatening..  89 year old on aspirin presents from home with chest discomfort that I suspect is symptomatic anemia requiring medical admission and blood  transfusions.  He is hemodynamically stable.  No active bleeding and no symptoms of bleeding.  He denies any melena, but he and his son report that he has had stomach bleeding in the past.  Macrocytic anemia with hemoglobin of 5.4.  We will initiate 2 units of PRBC transfusion.  First troponin is negative and he has no active chest pain.  Metabolic panel is largely normal.  CXR is clear.  Initiated PPI bolus and drip per protocols considering the possibility of upper GI bleeding and consulted with medicine for admission.  Clinical Course as of 12/29/22  [id number]  Sat Dec 29, 2022  0246 Reassessed.  Remains asymptomatic and stable.  His son is also at the bedside now.  We discussed anemia and possible sources with the high likelihood of GI losses.  We discussed blood transfusions and they are agreeable.  Discussed admission for GI evaluation in the morning.  They are agreeable. [DS]    Clinical Course User Index [DS] Vladimir Crofts, MD     FINAL CLINICAL IMPRESSION(S) / ED DIAGNOSES   Final diagnoses:  Symptomatic anemia  Other chest pain     Rx / DC Orders   ED Discharge Orders     None        Note:  This document was prepared using Dragon voice recognition software and may include unintentional dictation errors.   Vladimir Crofts, MD 12/29/22 [phone]

## 2022-12-29 NOTE — ED Notes (Signed)
Nitro paste previously applied by EMS now removed

## 2022-12-29 NOTE — Consult Note (Signed)
Derek Darby, MD [address]  [place]  [place], [place] [address]  Main: [phone]  Fax: [phone] Pager: [phone]   Consultation  Referring Provider:     No ref. provider found Primary Care Physician:  Rusty Aus, MD Primary Gastroenterologist: Althia Forts         Reason for Consultation:     Acute symptomatic anemia  Date of Admission:  12/29/2022 Date of Consultation:  12/29/2022         HPI:   Derek Haney is a 89 y.o. male history of COPD, coronary artery disease presented with chest pain and exertional dyspnea.  Patient had a visit with his PCP on 1/31, was treated with Levaquin as well as prednisone for possible pneumonia, he is dyspnea did not improve.  He also had drop in hemoglobin from 12.6 in September to 8.4.  Troponins were negative, BNP was normal.  Patient denies any abdominal pain, nausea, vomiting, melena, hematochezia or hematemesis or hematuria.  He did have a small nosebleed about 2 weeks ago.  Patient's son is also bedside.  Patient takes aspirin 81 mg only as well as meloxicam.  Patient was hemodynamically stable in the ER, labs revealed severe anemia hemoglobin 5.4, MCV 105.1, elevated BUN/creatinine 27/0.83.  He had elevated BUN/creatinine in 04/2021.  Therefore, GI is consulted for further evaluation Patient is up 2 units of PRBCs in the ER  NSAIDs: Aspirin 81 and Mobic  Antiplts/Anticoagulants/Anti thrombotics: None  GI Procedures: More than 10 years ago  Past Medical History:  Diagnosis Date   Anemia    Cancer ([place])    basal cell   CHF (congestive heart failure) ([facility])    Coronary artery disease    Dysrhythmia    GERD (gastroesophageal reflux disease)    Glaucoma (increased eye pressure)    Hypertension    Moderate COPD (chronic obstructive pulmonary disease) ([place]) 06/19/2018   Myocardial infarction [facility])    Wears dentures    partial upper    Past Surgical History:  Procedure Laterality Date   CATARACT EXTRACTION  W/PHACO Left 01/17/2022   Procedure: CATARACT EXTRACTION PHACO AND INTRAOCULAR LENS PLACEMENT ([place]) LEFT MALYUGIN VISION BLUE KAHOOK DUAL BLADE GONIOTOMY;  Surgeon: Leandrew Koyanagi, MD;  Location: [place];  Service: Ophthalmology;  Laterality: Left;  13.73 01:56.5   CORONARY ANGIOPLASTY     TONSILLECTOMY     TOTAL HIP ARTHROPLASTY Right 03/21/2016   Procedure: TOTAL HIP ARTHROPLASTY;  Surgeon: [name], MD;  Location: [facility] ORS;  Service: Orthopedics;  Laterality: Right;     Current Facility-Administered Medications:    0.9 %  sodium chloride infusion, 10 mL/hr, Intravenous, Once, Vladimir Crofts, MD   0.9 %  sodium chloride infusion, , Intravenous, Continuous, Ivor Costa, MD   0.9 %  sodium chloride infusion, , Intravenous, Continuous, Kapena Hamme, Tally Due, MD, Held at 12/29/22 1423   acetaminophen (TYLENOL) tablet 650 mg, 650 mg, Oral, Q6H PRN **OR** acetaminophen (TYLENOL) suppository 650 mg, 650 mg, Rectal, Q6H PRN, Mansy, Jan A, MD   albuterol (PROVENTIL) (2.5 MG/3ML) 0.083% nebulizer solution 3 mL, 3 mL, Inhalation, Q4H PRN, Ivor Costa, MD   atorvastatin (LIPITOR) tablet 20 mg, 20 mg, Oral, q1800, Mansy, Jan A, MD   dextromethorphan-guaiFENesin (MUCINEX DM) 30-600 MG per 12 hr tablet 1 tablet, 1 tablet, Oral, BID PRN, Ivor Costa, MD   dorzolamide (TRUSOPT) 2 % ophthalmic solution 1 drop, 1 drop, Both Eyes, BID, Ivor Costa, MD   fluticasone furoate-vilanterol (BREO  ELLIPTA) 100-25 MCG/ACT 1 puff, 1 puff, Inhalation, Daily, Mansy, Jan A, MD, 1 puff at 12/29/22 1342   hydrALAZINE (APRESOLINE) injection 5 mg, 5 mg, Intravenous, Q2H PRN, Ivor Costa, MD   isosorbide mononitrate (IMDUR) 24 hr tablet 30 mg, 30 mg, Oral, TID, Mansy, Jan A, MD, 30 mg at 12/29/22 1049   latanoprost (XALATAN) 0.005 % ophthalmic solution 1 drop, 1 drop, Right Eye, QHS, Ivor Costa, MD   magnesium hydroxide (MILK OF MAGNESIA) suspension 30 mL, 30 mL, Oral, Daily PRN, Mansy, Jan A, MD   ondansetron  (ZOFRAN) tablet 4 mg, 4 mg, Oral, Q6H PRN **OR** ondansetron (ZOFRAN) injection 4 mg, 4 mg, Intravenous, Q6H PRN, Mansy, Jan A, MD   [START ON 01/02/2023] pantoprazole (PROTONIX) injection 40 mg, 40 mg, Intravenous, Q12H, Aleda Madl, Tally Due, MD   pantoprozole (PROTONIX) 80 mg /NS 100 mL infusion, 8 mg/hr, Intravenous, Continuous, Florence Yeung, Tally Due, MD, Last Rate: 10 mL/hr at 12/29/22 1256, 8 mg/hr at 12/29/22 1256   polyethylene glycol-electrolytes (NuLYTELY) solution 4,000 mL, 4,000 mL, Oral, Once, [place], Tally Due, MD   propranolol (INDERAL) tablet 20 mg, 20 mg, Oral, QID, Mansy, Jan A, MD, 20 mg at 12/29/22 1307   sucralfate (CARAFATE) tablet 1 g, 1 g, Oral, QID, Mansy, Jan A, MD, 1 g at 12/29/22 1307   traZODone (DESYREL) tablet 100 mg, 100 mg, Oral, QHS, Mansy, Jan A, MD   traZODone (DESYREL) tablet 25 mg, 25 mg, Oral, QHS PRN, Mansy, Arvella Merles, MD   Family History  Problem Relation Age of Onset   Diabetes Child      Social History   Tobacco Use   Smoking status: Former    Packs/day: 1.00    Years: 15.00    Total pack years: 15.00    Types: Cigarettes    Quit date: 2000    Years since quitting: 24.1   Smokeless tobacco: Former    Types: Chew    Quit date: 2000   Tobacco comments:    it's been over 20 since he quit  Vaping Use   Vaping Use: Never used  Substance Use Topics   Alcohol use: Not Currently    Comment: occ   Drug use: No    Allergies as of 12/29/2022 - Review Complete 12/29/2022  Allergen Reaction Noted   Aspirin Other (See Comments) 06/10/2012   Penicillins Rash 03/07/2016    Review of Systems:    All systems reviewed and negative except where noted in HPI.   Physical Exam:  Vital signs in last 24 hours: Temp:  [97.7 F (36.5 C)-98.2 F (36.8 C)] 97.7 F (36.5 C) (02/10 1237) Pulse Rate:  [60-78] 60 (02/10 1237) Resp:  [14-27] 19 (02/10 1320) BP: (102-138)/(53-88) 129/63 (02/10 1237) SpO2:  [97 %-100 %] 97 % (02/10 1237) Weight:  [86.2 kg]  86.2 kg (02/10 0154) Last BM Date : 12/28/22 General:   Pleasant, cooperative in NAD Head:  Normocephalic and atraumatic. Eyes:   No icterus.   Conjunctiva pink. PERRLA. Ears:  Normal auditory acuity. Neck:  Supple; no masses or thyroidomegaly Lungs: Respirations even and unlabored. Lungs clear to auscultation bilaterally.   No wheezes, crackles, or rhonchi.  Heart:  Regular rate and rhythm;  Without murmur, clicks, rubs or gallops Abdomen:  Soft, nondistended, nontender. Normal bowel sounds. No appreciable masses or hepatomegaly.  No rebound or guarding.  Rectal:  Not performed. Msk:  Symmetrical without gross deformities.  Strength generalized weakness Extremities: Trace edema, no cyanosis or clubbing. Neurologic:  Alert and oriented x3;  grossly normal neurologically. Skin:  Intact without significant lesions or rashes. Psych:  Alert and cooperative. Normal affect.  LAB RESULTS:    Latest Ref Rng & Units 12/29/2022    1:24 PM 12/29/2022    4:02 AM 12/29/2022    1:59 AM  CBC  WBC 4.0 - 10.5 K/uL  2.9  3.3   Hemoglobin 13.0 - 17.0 g/dL 7.5  5.0  5.4   Hematocrit 39.0 - 52.0 % 21.5  15.0  16.4   Platelets 150 - 400 K/uL  102  127     BMET    Latest Ref Rng & Units 12/29/2022    4:02 AM 12/29/2022    1:59 AM 11/21/2022   11:51 AM  BMP  Glucose 70 - 99 mg/dL 138  156    BUN 8 - 23 mg/dL 26  27    Creatinine 0.61 - 1.24 mg/dL 0.83  0.83  0.80   Sodium 135 - 145 mmol/L 133  132    Potassium 3.5 - 5.1 mmol/L 4.2  4.1    Chloride 98 - 111 mmol/L 103  100    CO2 22 - 32 mmol/L 23  24    Calcium 8.9 - 10.3 mg/dL 8.6  9.0      LFT    Latest Ref Rng & Units 12/29/2022    1:59 AM 05/10/2021   11:40 PM 04/03/2016    7:37 AM  Hepatic Function  Total Protein 6.5 - 8.1 g/dL 7.0  4.2  6.8   Albumin 3.5 - 5.0 g/dL 3.6  1.8  3.1   AST 15 - 41 U/L '17  16  22   '$ ALT 0 - 44 U/L '16  12  19   '$ Alk Phosphatase 38 - 126 U/L 77  39  96   Total Bilirubin 0.3 - 1.2 mg/dL 1.0  0.6  0.7       STUDIES: DG Chest Portable 1 View  Result Date: 12/29/2022 CLINICAL DATA:  chest pain/SOB EXAM: PORTABLE CHEST 1 VIEW COMPARISON:  Chest x-ray 05/11/2021, CT chest 11/21/2022 FINDINGS: The heart and mediastinal contours are unchanged. Aortic calcification. No focal consolidation. Chronic coarsened interstitial markings with no overt pulmonary edema. No pleural effusion. No pneumothorax. No acute osseous abnormality. IMPRESSION: 1. No active disease. 2. Aortic Atherosclerosis (ICD10-I70.0) and Emphysema (ICD10-J43.9). Electronically Signed   By: Iven Finn M.D.   On: 12/29/2022 02:28      Impression / Plan:   Derek Haney is a 89 y.o. male with history of COPD, coronary artery disease presented with severe symptomatic anemia.  Patient was recently treated for possible pneumonia secondary to exertional dyspnea with Levaquin and prednisone about 10 days ago.  Severe symptomatic iron deficiency anemia: Elevated BUN/creatinine Most likely an upper GI bleed Continue IV Protonix Monitor CBC closely to maintain hemoglobin above 8 Discussed with patient and his son regarding upper endoscopy followed by colonoscopy Continue clear liquid diet today Bowel prep ordered  I have discussed alternative options, risks & benefits,  which include, but are not limited to, bleeding, infection, perforation,respiratory complication & drug reaction.  The patient agrees with this plan & written consent will be obtained.     Thank you for involving me in the care of this patient.      LOS: 0 days   Sherri Sear, MD  12/29/2022, 2:32 PM    Note: This dictation was prepared with Dragon dictation along with smaller phrase technology. Any transcriptional errors that  result from this process are unintentional.

## 2022-12-29 NOTE — H&P (Signed)
History and Physical    Derek Haney [id number] DOB: [birth year] DOA: 12/29/2022  Referring MD/NP/PA:   PCP: Rusty Aus, MD   Patient coming from:  The patient is coming from home.       Chief Complaint: SOB   HPI: Derek Haney is a 89 y.o. male with medical history significant of COPD on 2L of O2 at night, HTN, HLD, CAD, dCHF, gerd, anemia, who presents with SOB.   Patient states that he has shortness of breath recently, which has been progressively worsening.  No cough, fever or chills.  He reports intermittent mild chest pain and chest pressure, which has resolved.  Currently no active chest pain.  No nausea, vomiting, diarrhea or abdominal pain.  No symptoms of UTI.  Patient denies dark stool or rectal bleeding.  He reports small nosebleeding recently.  Currently no active nosebleeding. Patient is on aspirin and Mobic.  Data reviewed independently and ED Course: pt was found to have hemoglobin 5.4 --> 5.0 (11.0 on 05/11/2021), pancytopenia with WBC 2.9 and platelet 102, troponin level 10  --> 11, BNP 47, GFR> 60, temperature normal, blood pressure 117/53, heart rate 67, RR 27, oxygen saturation 100% on 2 L oxygen.  Chest x-ray negative. Pt is admitted to PCU as inpatient.  Dr. Marius Ditch of GI is consulted.  EKG: I have personally reviewed.  Sinus rhythm, right bundle blockade, QTc 448, Q wave in lead III, T wave inversion in inferior leads, ST depression in precordial leads.   Review of Systems:   General: no fevers, chills, no body weight gain, has fatigue HEENT: no blurry vision, hearing changes or sore throat Respiratory: has dyspnea, no coughing, wheezing CV: no chest pain, no palpitations GI: no nausea, vomiting, abdominal pain, diarrhea, constipation GU: no dysuria, burning on urination, increased urinary frequency, hematuria  Ext: has trace leg edema Neuro: no unilateral weakness, numbness, or tingling, no vision change or hearing loss Skin: no rash, no skin  tear. MSK: No muscle spasm, no deformity, no limitation of range of movement in spin Heme: No easy bruising.  Travel history: No recent long distant travel.   Allergy:  Allergies  Allergen Reactions   Aspirin Other (See Comments)    Occult blood in stool; higher doses- Pt can take small dose coated tablet   Penicillins Rash    Past Medical History:  Diagnosis Date   Anemia    Cancer (HCC)    basal cell   CHF (congestive heart failure) (HCC)    Coronary artery disease    Dysrhythmia    GERD (gastroesophageal reflux disease)    Glaucoma (increased eye pressure)    Hypertension    Moderate COPD (chronic obstructive pulmonary disease) ([place]) 06/19/2018   Myocardial infarction [facility])    Wears dentures    partial upper    Past Surgical History:  Procedure Laterality Date   CATARACT EXTRACTION W/PHACO Left 01/17/2022   Procedure: CATARACT EXTRACTION PHACO AND INTRAOCULAR LENS PLACEMENT ([place]) LEFT MALYUGIN VISION BLUE KAHOOK DUAL BLADE GONIOTOMY;  Surgeon: Leandrew Koyanagi, MD;  Location: [place];  Service: Ophthalmology;  Laterality: Left;  13.73 01:56.5   CORONARY ANGIOPLASTY     TONSILLECTOMY     TOTAL HIP ARTHROPLASTY Right 03/21/2016   Procedure: TOTAL HIP ARTHROPLASTY;  Surgeon: [name], MD;  Location: [facility];  Service: Orthopedics;  Laterality: Right;    Social History:  reports that he quit smoking about 24 years ago. His smoking use included cigarettes. He has a 15.00  pack-year smoking history. He quit smokeless tobacco use about 24 years ago.  His smokeless tobacco use included chew. He reports that he does not currently use alcohol. He reports that he does not use drugs.  Family History:  Family History  Problem Relation Age of Onset   Diabetes Child      Prior to Admission medications   Medication Sig Start Date End Date Taking? Authorizing Provider  ASPIRIN 81 PO Take 1 tablet by mouth daily.   Yes [provider]  atorvastatin  (LIPITOR) 20 MG tablet Take 20 mg by mouth daily at 6 PM.   Yes [provider]  bimatoprost (LUMIGAN) 0.01 % SOLN Place 1 drop into both eyes at bedtime.   Yes [provider]  cycloSPORINE (RESTASIS) 0.05 % ophthalmic emulsion Place 1 drop into both eyes 2 (two) times daily.   Yes [provider]  dorzolamide (TRUSOPT) 2 % ophthalmic solution Place 1 drop into both eyes 2 (two) times daily.   Yes [provider]  FERGON 240 (27 Fe) MG tablet Take 1 tablet by mouth every morning. 10/30/22  Yes [provider]  ferrous sulfate 325 (65 FE) MG tablet Take 325 mg by mouth daily with breakfast.   Yes [provider]  isosorbide mononitrate (IMDUR) 30 MG 24 hr tablet Take 30 mg by mouth 3 (three) times daily. 12/18/22  Yes [provider]  meloxicam (MOBIC) 7.5 MG tablet Take 7.5 mg by mouth daily.   Yes [provider]  pantoprazole (PROTONIX) 40 MG tablet Take 40 mg by mouth daily.   Yes [provider]  Polyethyl Glycol-Propyl Glycol (SYSTANE FREE OP) Apply 2 drops to eye in the morning and at bedtime.   Yes [provider]  potassium chloride SA (K-DUR,KLOR-CON) 20 MEQ tablet Take 20 mEq by mouth daily.   Yes [provider]  predniSONE (DELTASONE) 10 MG tablet Take 10 mg by mouth daily with breakfast. 12/19/22 01/08/23 Yes [provider]  propranolol (INDERAL) 20 MG tablet Take 20 mg by mouth 4 (four) times daily.   Yes [provider]  sucralfate (CARAFATE) 1 g tablet Take 1 g by mouth 4 (four) times daily.   Yes [provider]  traZODone (DESYREL) 50 MG tablet Take 100 mg by mouth at bedtime.   Yes [provider]  TRELEGY ELLIPTA 100-62.5-25 MCG/INH AEPB INHALE 1 PUFFS BY MOUTH ONCE DAILY 04/08/19  Yes Wilhelmina Mcardle, MD  albuterol (PROVENTIL HFA;VENTOLIN HFA) 108 (90 Base) MCG/ACT inhaler Inhale 1-2 puffs into the lungs every 6 (six) hours as needed for wheezing  or shortness of breath. Patient not taking: Reported on 12/29/2022 06/03/18   Wilhelmina Mcardle, MD  furosemide (LASIX) 20 MG tablet Take 20 mg by mouth daily. Patient not taking: Reported on 01/10/2022    [provider]  oxymetazoline (NASAL RELIEF) 0.05 % nasal spray Place 1 spray into both nostrils 2 (two) times daily as needed.    [provider]    Physical Exam: Vitals:   12/29/22 0823 12/29/22 0900 12/29/22 0915 12/29/22 0932  BP:  117/66 124/64   Pulse:  66 64   Resp:  16 17   Temp:   98.2 F (36.8 C)   TempSrc:      SpO2: 100% 100% 100% 99%  Weight:       General: Not in acute distress. Pale looking. HEENT:       Eyes: PERRL, EOMI, no scleral icterus.  ENT: No discharge from the ears and nose, no pharynx injection, no tonsillar enlargement.        Neck: No JVD, no bruit, no mass felt. Heme: No neck lymph node enlargement. Cardiac: S1/S2, RRR, No murmurs, No gallops or rubs. Respiratory: No rales, wheezing, rhonchi or rubs. GI: Soft, nondistended, nontender, no rebound pain, no organomegaly, BS present. GU: No hematuria Ext: has trace leg edema bilaterally. 1+DP/PT pulse bilaterally. Musculoskeletal: No joint deformities, No joint redness or warmth, no limitation of ROM in spin. Skin: No rashes.  Neuro: Alert, oriented X3, cranial nerves II-XII grossly intact, moves all extremities normally. Psych: Patient is not psychotic, no suicidal or hemocidal ideation.  Labs on Admission: I have personally reviewed following labs and imaging studies  CBC: Recent Labs  Lab 12/29/22 0159 12/29/22 0402  WBC 3.3* 2.9*  NEUTROABS 1.5*  --   HGB 5.4* 5.0*  HCT 16.4* 15.0*  MCV 105.1* 102.7*  PLT 127* [id number]*   Basic Metabolic Panel: Recent Labs  Lab 12/29/22 0159 12/29/22 0402  NA 132* 133*  K 4.1 4.2  CL 100 103  CO2 24 23  GLUCOSE 156* 138*  BUN 27* 26*  CREATININE 0.83 0.83  CALCIUM 9.0 8.6*   GFR: CrCl cannot be calculated (Unknown ideal  weight.). Liver Function Tests: Recent Labs  Lab 12/29/22 0159  AST 17  ALT 16  ALKPHOS 77  BILITOT 1.0  PROT 7.0  ALBUMIN 3.6   No results for input(s): "LIPASE", "AMYLASE" in the last 168 hours. No results for input(s): "AMMONIA" in the last 168 hours. Coagulation Profile: Recent Labs  Lab 12/29/22 0159  INR 1.2   Cardiac Enzymes: No results for input(s): "CKTOTAL", "CKMB", "CKMBINDEX", "TROPONINI" in the last 168 hours. BNP (last 3 results) No results for input(s): "PROBNP" in the last 8760 hours. HbA1C: No results for input(s): "HGBA1C" in the last 72 hours. CBG: No results for input(s): "GLUCAP" in the last 168 hours. Lipid Profile: No results for input(s): "CHOL", "HDL", "LDLCALC", "TRIG", "CHOLHDL", "LDLDIRECT" in the last 72 hours. Thyroid Function Tests: No results for input(s): "TSH", "T4TOTAL", "FREET4", "T3FREE", "THYROIDAB" in the last 72 hours. Anemia Panel: Recent Labs    12/29/22 0800  RETICCTPCT 3.8*   Urine analysis:    Component Value Date/Time   COLORURINE YELLOW (A) 03/07/2016 1514   APPEARANCEUR CLEAR (A) 03/07/2016 1514   LABSPEC 1.017 03/07/2016 1514   PHURINE 5.0 03/07/2016 1514   GLUCOSEU NEGATIVE 03/07/2016 1514   HGBUR NEGATIVE 03/07/2016 1514   BILIRUBINUR NEGATIVE 03/07/2016 1514   KETONESUR NEGATIVE 03/07/2016 1514   PROTEINUR NEGATIVE 03/07/2016 1514   NITRITE NEGATIVE 03/07/2016 1514   LEUKOCYTESUR NEGATIVE 03/07/2016 1514   Sepsis Labs: @LABRCNTIP$ (procalcitonin:4,lacticidven:4) )No results found for this or any previous visit (from the past 240 hour(s)).   Radiological Exams on Admission: DG Chest Portable 1 View  Result Date: 12/29/2022 CLINICAL DATA:  chest pain/SOB EXAM: PORTABLE CHEST 1 VIEW COMPARISON:  Chest x-ray 05/11/2021, CT chest 11/21/2022 FINDINGS: The heart and mediastinal contours are unchanged. Aortic calcification. No focal consolidation. Chronic coarsened interstitial markings with no overt pulmonary  edema. No pleural effusion. No pneumothorax. No acute osseous abnormality. IMPRESSION: 1. No active disease. 2. Aortic Atherosclerosis (ICD10-I70.0) and Emphysema (ICD10-J43.9). Electronically Signed   By: Iven Finn M.D.   On: 12/29/2022 02:28      Assessment/Plan Principal Problem:   Symptomatic anemia Active Problems:   Pancytopenia (HCC)   CAD (coronary artery disease)   Chronic diastolic CHF (congestive heart  failure) (HCC)   Benign essential hypertension   Moderate COPD (chronic obstructive pulmonary disease) (HCC)   HLD (hyperlipidemia)   GERD (gastroesophageal reflux disease)   Assessment and Plan:  Symptomatic anemia and pancytopenia ([place]):  Hgb 11.0 --> 5.4 --> 5.0.  Etiology is not clear.  Patient denies dark stool or rectal bleeding.  Patient is on aspirin and Mobic.  Suspecting GI bleeding.  Consulted GI, Dr. Marius Ditch.  If patient is found to not have GI bleeding, may need to do hematology workup.   - will admitted to progressive bed as inpatient - transfuse 2 units of blood now - IVF: 75 mL/hr of NS - Start IV pantoprazole 40 mg bid - Check anemia panel -Continue iron supplement and Carafate - Zofran IV for nausea - Avoid NSAIDs and SQ heparin - Maintain IV access (2 large bore IVs if possible). - Monitor closely and follow q6h cbc, transfuse as necessary, if Hgb<7.0 - [facility]: INR, PTT and type screen  CAD (coronary artery disease): trop 10 --> 11 -hold ASA -Lipitor and imdur  Chronic diastolic CHF (congestive heart failure) [facility]): Patient has trace leg edema, normal BNP 47, does not seem to have CHF exacerbation.  2D echo on 01/09/2018 showed EF> 55%. -Hold Lasix due to GI bleeding  Benign essential hypertension -IV hydralazine as needed -Imdur and propranolol  Moderate COPD (chronic obstructive pulmonary disease) (HCC): Stable -Bronchodilators  HLD (hyperlipidemia) -Lipitor  GERD (gastroesophageal reflux disease) -On IV Protonix      DVT ppx:  SCD  Code Status: Full code  Family Communication:   Yes, patient's son by phone  Disposition Plan:  Anticipate discharge back to previous environment  Consults called:  Dr. Marius Ditch of GI  Admission status and Level of care: Progressive:    as inpt      Dispo: The patient is from: Home              Anticipated d/c is to: Home              Anticipated d/c date is: 2 days              Patient currently is not medically stable to d/c.    Severity of Illness:  The appropriate patient status for this patient is INPATIENT. Inpatient status is judged to be reasonable and necessary in order to provide the required intensity of service to ensure the patient's safety. The patient's presenting symptoms, physical exam findings, and initial radiographic and laboratory data in the context of their chronic comorbidities is felt to place them at high risk for further clinical deterioration. Furthermore, it is not anticipated that the patient will be medically stable for discharge from the hospital within 2 midnights of admission.   * I certify that at the point of admission it is my clinical judgment that the patient will require inpatient hospital care spanning beyond 2 midnights from the point of admission due to high intensity of service, high risk for further deterioration and high frequency of surveillance required.*       Date of Service 12/29/2022    Ivor Costa  [facility]   If 7PM-7AM, please contact night-coverage [email or web address] 12/29/2022, 10:13 AM

## 2022-12-29 NOTE — ED Notes (Signed)
Morning H&H was not draw due to the blood transfusion just completing.

## 2022-12-30 ENCOUNTER — Encounter
Admission: EM | Disposition: A | Discharge status: Home / Self Care | Payer: Self-pay | Source: Home / Self Care | Attending: Emergency Medicine

## 2022-12-30 ENCOUNTER — Inpatient Hospital Stay: Payer: Medicare PPO | Admitting: Anesthesiology

## 2022-12-30 ENCOUNTER — Encounter: Payer: Self-pay | Admitting: Internal Medicine

## 2022-12-30 DIAGNOSIS — K226 Gastro-esophageal laceration-hemorrhage syndrome: Secondary | ICD-10-CM

## 2022-12-30 DIAGNOSIS — D649 Anemia, unspecified: Secondary | ICD-10-CM | POA: Diagnosis not present

## 2022-12-30 DIAGNOSIS — K635 Polyp of colon: Secondary | ICD-10-CM

## 2022-12-30 HISTORY — PX: ESOPHAGOGASTRODUODENOSCOPY (EGD) WITH PROPOFOL: SHX5813

## 2022-12-30 HISTORY — PX: COLONOSCOPY WITH PROPOFOL: SHX5780

## 2022-12-30 LAB — PREPARE RBC (CROSSMATCH)

## 2022-12-30 LAB — HEMOGLOBIN AND HEMATOCRIT, BLOOD
HCT: 21.3 % — ABNORMAL LOW (ref 39.0–52.0)
HCT: 20.8 % — ABNORMAL LOW (ref 39.0–52.0)
HCT: 21.3 % — ABNORMAL LOW (ref 39.0–52.0)
Hemoglobin: 7.3 g/dL — ABNORMAL LOW (ref 13.0–17.0)
Hemoglobin: 7.1 g/dL — ABNORMAL LOW (ref 13.0–17.0)
Hemoglobin: 7.3 g/dL — ABNORMAL LOW (ref 13.0–17.0)

## 2022-12-30 SURGERY — ESOPHAGOGASTRODUODENOSCOPY (EGD) WITH PROPOFOL
Anesthesia: General

## 2022-12-30 MED ORDER — VITAMIN B-12 1000 MCG PO TABS: 1000.0000 ug | ORAL_TABLET | Freq: Every day | ORAL | Status: DC

## 2022-12-30 MED ORDER — EPHEDRINE SULFATE (PRESSORS) 50 MG/ML IJ SOLN
INTRAMUSCULAR | Status: DC | PRN
  Administered 2022-12-30: 10 mg via INTRAVENOUS
  Administered 2022-12-30: 5 mg via INTRAVENOUS
  Administered 2022-12-30: 10 mg via INTRAVENOUS

## 2022-12-30 MED ORDER — PANTOPRAZOLE SODIUM 40 MG PO TBEC: 40.0000 mg | DELAYED_RELEASE_TABLET | Freq: Every day | ORAL | 2 refills | Status: DC

## 2022-12-30 MED ORDER — PHENYLEPHRINE HCL (PRESSORS) 10 MG/ML IV SOLN
INTRAVENOUS | Status: DC | PRN
  Administered 2022-12-30 (×2): 160 ug via INTRAVENOUS
  Administered 2022-12-30 (×3): 80 ug via INTRAVENOUS
  Administered 2022-12-30: 160 ug via INTRAVENOUS

## 2022-12-30 MED ORDER — PROPOFOL 10 MG/ML IV BOLUS
INTRAVENOUS | Status: DC | PRN
  Administered 2022-12-30: 60 mg via INTRAVENOUS

## 2022-12-30 MED ORDER — SODIUM CHLORIDE 0.9% IV SOLUTION: Freq: Once | INTRAVENOUS | Status: AC

## 2022-12-30 MED ORDER — LIDOCAINE 2% (20 MG/ML) 5 ML SYRINGE
INTRAMUSCULAR | Status: DC | PRN
  Administered 2022-12-30: 100 mg via INTRAVENOUS

## 2022-12-30 MED ORDER — PROPOFOL 500 MG/50ML IV EMUL
INTRAVENOUS | Status: DC | PRN
  Administered 2022-12-30: 125 ug/kg/min via INTRAVENOUS

## 2022-12-30 MED ORDER — CYANOCOBALAMIN 1000 MCG PO TABS: 1000.0000 ug | ORAL_TABLET | Freq: Every day | ORAL | 2 refills | Status: DC

## 2022-12-30 NOTE — Anesthesia Postprocedure Evaluation (Signed)
Anesthesia Post Note  Patient: Derek Haney  Procedure(s) Performed: ESOPHAGOGASTRODUODENOSCOPY (EGD) WITH PROPOFOL COLONOSCOPY WITH PROPOFOL  Patient location during evaluation: PACU Anesthesia Type: General Level of consciousness: awake and alert Pain management: pain level controlled Vital Signs Assessment: post-procedure vital signs reviewed and stable Respiratory status: spontaneous breathing, nonlabored ventilation, respiratory function stable and patient connected to nasal cannula oxygen Cardiovascular status: blood pressure returned to baseline and stable Postop Assessment: no apparent nausea or vomiting Anesthetic complications: no   No notable events documented.   Last Vitals:  Vitals:   12/30/22 0834 12/30/22 1149  BP: 134/61 (!) 102/45  Pulse: 99 66  Resp: 16 18  Temp: 36.5 C   SpO2: 94% 100%    Last Pain:  Vitals:   12/30/22 1149  TempSrc:   PainSc: 0-No pain                 Ilene Qua

## 2022-12-30 NOTE — Discharge Summary (Signed)
Physician Discharge Summary  Derek Haney [id number] DOB: 18-Mar-1933 DOA: 12/29/2022  PCP: Rusty Aus, MD  Admit date: 12/29/2022 Discharge date: 12/30/2022  Admitted From: Home Discharge disposition: Home  Recommendations at discharge:  Protonix for 3 months Stop Mobic. Okay to resume aspirin 81 mg tomorrow.   Brief narrative: Derek Haney is a 89 y.o. male with PMH significant for COPD on 2 L O2, HTN, HLD, CAD/MI, diastolic CHF, GERD, chronic anemia. Over the last 1 week, patient was having intermittent chest pain.  On the night of 2/9, patient had severe episode which can of sleep, responded to sublingual nitroglycerin and patient subsequently presented to the ED. Upon chart review, it was also noted that on 1/31, patient was seen by PCP for dyspnea on exertion, given Levaquin for possible pneumonia without effect.  It was also noted that he had a hemoglobin of 12.6 in September 2023 which dropped to 8.4 in December 2023.  Patient takes aspirin 81 mg daily and as needed meloxicam.  In the ED, patient was afebrile, hemodynamically stable Initial labs with sodium low at 132, WBC low at 3.3, hemoglobin low at 5.4, with MCV elevated to 105 platelet low at 127, troponin normal Chest x-ray unremarkable Patient was given 2 units of PRBC transfusion started on IV Protonix Admitted to [facility]  Dr. Marius Ditch, GI was consulted.   Subjective: Patient was seen and examined this morning.  Pleasant, elderly Caucasian male.  Lying on bed.  Not in distress.  Son at bedside. Later this morning, he had EGD.  Findings as below. Noted hemoglobin trend, 7.1 this morning.  Assessment and plan: Subacute GI bleeding Presented with progressively worsening shortness of breath, intermittent chest pain Noted to have slow drop in hemoglobin over the course of months.  Chronically on aspirin and meloxicam GI evaluation obtained. Currently on IV Protonix.  Aspirin and NSAIDs on hold Noted plan of EGD  and colonoscopy EGD noted submucosal tear in the lower third of the esophagus.  GI recommended PPI daily for 3 months Colonoscopy showed 4 to 5 mm polyps in the sigmoid colon, descending colon, transverse colon that were removed with a cold snare..  Severe diverticulosis in the entire colon noted without diverticular bleeding.  Acute blood loss anemia Vitamin B12 deficiency Secondary to GI bleeding.  Hemoglobin trend as below.  Received 2 units PRBC transfusion since admission.  Hemoglobin low at 7.1 this morning.  Given his pre-existing cardiac status, will give 1 more unit of PRBC today. Noted elevated MCV and vitamin B12 level in low normal range.  Started vitamin B12 supplement. Recent Labs    12/29/22 0159 12/29/22 0402 12/29/22 0800 12/29/22 1324 12/29/22 1803 12/30/22 0131 12/30/22 0811 12/30/22 1347  HGB 5.4* 5.0*  --  7.5* 7.1* 7.3* 7.1* 7.3*  MCV 105.1* 102.7*  --   --   --   --   --   --   VITAMINB12 248  --   --   --   --   --   --   --   FOLATE  --  9.2  --   --   --   --   --   --   FERRITIN  --  99  --   --   --   --   --   --   TIBC  --  325  --   --   --   --   --   --   IRON  --  120  --   --   --   --   --   --   RETICCTPCT  --   --  3.8*  --   --   --   --   --      CAD/HLD Troponin normal. Aspirin on hold currently.  Okay to resume tomorrow per GI Continue Lipitor and imdur   Chronic diastolic CHF Essential hypertension Patient has trace leg edema, normal BNP 47, does not seem to have CHF exacerbation.   2D echo on 01/09/2018 showed EF> 55%. Lasix is in his home med list but evidently was not taking it at home.  Continue Imdur and propranolol   Moderate COPD stable on bronchodilators.  It seems patient was recently started on 3 weeks course of prednisone for shortness of breath.  I will stop it at this time as his shortness of breath was due to anemia.  Wounds:  -    Discharge Exam:   Vitals:   12/30/22 1229 12/30/22 1305 12/30/22 1356 12/30/22  1413  BP: (!) 112/59 (!) 121/49 (!) 108/52 (!) 108/46  Pulse: 64 64 70 71  Resp: (!) 9 16 16 16  $ Temp:  97.6 F (36.4 C) 97.7 F (36.5 C) (!) 97.5 F (36.4 C)  TempSrc:  Oral Oral Oral  SpO2: 96% 95% 97% 92%  Weight:        Body mass index is 27.26 kg/m.  General exam: Pleasant, elderly Caucasian male.  Not in distress Skin: No rashes, lesions or ulcers. HEENT: Atraumatic, normocephalic, no obvious bleeding Lungs: Clear to auscultation bilaterally CVS: Regular rate and rhythm, no murmur GI/Abd soft, nontender, nondistended, bowel sound present CNS: Alert, awake, oriented x 3 Psychiatry: Mood appropriate Extremities: No pedal edema, no calf tenderness  Follow ups:    Follow-up Information     Rusty Aus, MD Follow up.   Specialty: Internal Medicine Contact information: [place] [id number] [phone]         Rusty Aus, MD Follow up.   Specialty: Internal Medicine Contact information: [place] [address] [phone]                 Discharge Instructions:   Discharge Instructions     Call MD for:  difficulty breathing, headache or visual disturbances   Complete by: As directed    Call MD for:  extreme fatigue   Complete by: As directed    Call MD for:  hives   Complete by: As directed    Call MD for:  persistant dizziness or light-headedness   Complete by: As directed    Call MD for:  persistant nausea and vomiting   Complete by: As directed    Call MD for:  severe uncontrolled pain   Complete by: As directed    Call MD for:  temperature >100.4   Complete by: As directed    Diet - low sodium heart healthy   Complete by: As directed    Discharge instructions   Complete by: As directed    Recommendations at discharge:   Protonix for 3 months  Stop Mobic.  Resume aspirin 81 mg tomorrow.  General discharge  instructions: Follow with Primary MD Rusty Aus, MD in 7 days  Please request your PCP  to go over your hospital tests, procedures, radiology results at the follow up. Please get your medicines reviewed and adjusted.  Your PCP may decide to repeat certain labs or tests as needed. Do not drive, operate heavy machinery, perform activities at heights, swimming or participation in water activities or provide baby sitting services if your were admitted for syncope or siezures until you have seen by Primary MD or a Neurologist and advised to do so again. [place] Controlled Substance Reporting System database was reviewed. Do not drive, operate heavy machinery, perform activities at heights, swim, participate in water activities or provide baby-sitting services while on medications for pain, sleep and mood until your outpatient physician has reevaluated you and advised to do so again.  You are strongly recommended to comply with the dose, frequency and duration of prescribed medications. Activity: As tolerated with Full fall precautions use walker/cane & assistance as needed Avoid using any recreational substances like cigarette, tobacco, alcohol, or non-prescribed drug. If you experience worsening of your admission symptoms, develop shortness of breath, life threatening emergency, suicidal or homicidal thoughts you must seek medical attention immediately by calling 911 or calling your MD immediately  if symptoms less severe. You must read complete instructions/literature along with all the possible adverse reactions/side effects for all the medicines you take and that have been prescribed to you. Take any new medicine only after you have completely understood and accepted all the possible adverse reactions/side effects.  Wear Seat belts while driving. You were cared for by a hospitalist during your hospital stay. If you have any questions about your discharge medications or the care you received while  you were in the hospital after you are discharged, you can call the unit and ask to speak with the hospitalist or the covering physician. Once you are discharged, your primary care physician will handle any further medical issues. Please note that NO REFILLS for any discharge medications will be authorized once you are discharged, as it is imperative that you return to your primary care physician (or establish a relationship with a primary care physician if you do not have one).   Increase activity slowly   Complete by: As directed        Discharge Medications:   Allergies as of 12/30/2022       Reactions   Aspirin Other (See Comments)   Occult blood in stool; higher doses- Pt can take small dose coated tablet   Penicillins Rash        Medication List     STOP taking these medications    furosemide 20 MG tablet Commonly known as: LASIX   meloxicam 7.5 MG tablet Commonly known as: MOBIC   predniSONE 10 MG tablet Commonly known as: DELTASONE       TAKE these medications    albuterol 108 (90 Base) MCG/ACT inhaler Commonly known as: VENTOLIN HFA Inhale 1-2 puffs into the lungs every 6 (six) hours as needed for wheezing or shortness of breath.   ASPIRIN 81 PO Take 1 tablet by mouth daily.   atorvastatin 20 MG tablet Commonly known as: LIPITOR Take 20 mg by mouth daily at 6 PM.   bimatoprost 0.01 % Soln Commonly known as: LUMIGAN Place 1 drop into both eyes at bedtime.   cyanocobalamin 1000 MCG tablet Take 1 tablet (1,000 mcg total) by mouth daily.   cycloSPORINE 0.05 % ophthalmic emulsion Commonly known as: RESTASIS Place 1 drop into both eyes 2 (two) times daily.   dorzolamide 2 % ophthalmic solution Commonly known as: TRUSOPT Place 1 drop into both eyes 2 (two) times daily.   Fergon 240 (  27 FE) MG tablet Generic drug: ferrous gluconate Take 1 tablet by mouth every morning.   ferrous sulfate 325 (65 FE) MG tablet Take 325 mg by mouth daily with  breakfast.   isosorbide mononitrate 30 MG 24 hr tablet Commonly known as: IMDUR Take 30 mg by mouth 3 (three) times daily.   Nasal Relief 0.05 % nasal spray Generic drug: oxymetazoline Place 1 spray into both nostrils 2 (two) times daily as needed.   pantoprazole 40 MG tablet Commonly known as: PROTONIX Take 1 tablet (40 mg total) by mouth daily.   potassium chloride SA 20 MEQ tablet Commonly known as: KLOR-CON M Take 20 mEq by mouth daily.   propranolol 20 MG tablet Commonly known as: INDERAL Take 20 mg by mouth 4 (four) times daily.   sucralfate 1 g tablet Commonly known as: CARAFATE Take 1 g by mouth 4 (four) times daily.   SYSTANE FREE OP Apply 2 drops to eye in the morning and at bedtime.   traZODone 50 MG tablet Commonly known as: DESYREL Take 100 mg by mouth at bedtime.   Trelegy Ellipta 100-62.5-25 MCG/ACT Aepb Generic drug: Fluticasone-Umeclidin-Vilant INHALE 1 PUFFS BY MOUTH ONCE DAILY         The results of significant diagnostics from this hospitalization (including imaging, microbiology, ancillary and laboratory) are listed below for reference.    Procedures and Diagnostic Studies:   DG Chest Portable 1 View  Result Date: 12/29/2022 CLINICAL DATA:  chest pain/SOB EXAM: PORTABLE CHEST 1 VIEW COMPARISON:  Chest x-ray 05/11/2021, CT chest 11/21/2022 FINDINGS: The heart and mediastinal contours are unchanged. Aortic calcification. No focal consolidation. Chronic coarsened interstitial markings with no overt pulmonary edema. No pleural effusion. No pneumothorax. No acute osseous abnormality. IMPRESSION: 1. No active disease. 2. Aortic Atherosclerosis (ICD10-I70.0) and Emphysema (ICD10-J43.9). Electronically Signed   By: Iven Finn M.D.   On: 12/29/2022 02:28     Labs:   Basic Metabolic Panel: Recent Labs  Lab 12/29/22 0159 12/29/22 0402  NA 132* 133*  K 4.1 4.2  CL 100 103  CO2 24 23  GLUCOSE 156* 138*  BUN 27* 26*  CREATININE 0.83 0.83   CALCIUM 9.0 8.6*   GFR CrCl cannot be calculated (Unknown ideal weight.). Liver Function Tests: Recent Labs  Lab 12/29/22 0159  AST 17  ALT 16  ALKPHOS 77  BILITOT 1.0  PROT 7.0  ALBUMIN 3.6   No results for input(s): "LIPASE", "AMYLASE" in the last 168 hours. No results for input(s): "AMMONIA" in the last 168 hours. Coagulation profile Recent Labs  Lab 12/29/22 0159  INR 1.2    CBC: Recent Labs  Lab 12/29/22 0159 12/29/22 0402 12/29/22 1324 12/29/22 1803 12/30/22 0131 12/30/22 0811 12/30/22 1347  WBC 3.3* 2.9*  --   --   --   --   --   NEUTROABS 1.5*  --   --   --   --   --   --   HGB 5.4* 5.0* 7.5* 7.1* 7.3* 7.1* 7.3*  HCT 16.4* 15.0* 21.5* 20.9* 21.3* 20.8* 21.3*  MCV 105.1* 102.7*  --   --   --   --   --   PLT 127* 102*  --   --   --   --   --    Cardiac Enzymes: No results for input(s): "CKTOTAL", "CKMB", "CKMBINDEX", "TROPONINI" in the last 168 hours. BNP: Invalid input(s): "POCBNP" CBG: No results for input(s): "GLUCAP" in the last 168 hours. D-Dimer No results for  input(s): "DDIMER" in the last 72 hours. Hgb A1c No results for input(s): "HGBA1C" in the last 72 hours. Lipid Profile No results for input(s): "CHOL", "HDL", "LDLCALC", "TRIG", "CHOLHDL", "LDLDIRECT" in the last 72 hours. Thyroid function studies No results for input(s): "TSH", "T4TOTAL", "T3FREE", "THYROIDAB" in the last 72 hours.  Invalid input(s): "FREET3" Anemia work up Recent Labs    12/29/22 0159 12/29/22 0402 12/29/22 0800  VITAMINB12 248  --   --   FOLATE  --  9.2  --   FERRITIN  --  99  --   TIBC  --  325  --   IRON  --  120  --   RETICCTPCT  --   --  3.8*   Microbiology No results found for this or any previous visit (from the past 240 hour(s)).  Time coordinating discharge: 35 minutes  Signed: Ahmaud Duthie  [facility] 12/30/2022, 2:56 PM

## 2022-12-30 NOTE — Progress Notes (Signed)
Reached out to on-call provider with GI as patient states he cannot finish bowel prep. At that point, Bms still had solid pieces. Continued to encourage patient to finish prep until 5 am but patient still unable to complete. BM at 0600 was clear with some dark, fine sediment. Will pass info on to dayshift RN.

## 2022-12-30 NOTE — Op Note (Signed)
[facility] Gastroenterology Patient Name: Derek Haney Procedure Date: 12/30/2022 10:11 AM MRN: [id number] Account #: [id number] Date of Birth: December 20, 1932 Admit Type: Inpatient Age: 89 Room: [facility] ENDO ROOM 4 Gender: Male Note Status: Finalized Instrument Name: Colonoscope [id number] Procedure:             Colonoscopy Indications:           Unexplained iron deficiency anemia Providers:             Lin Landsman MD, MD Referring MD:          Rusty Aus, MD (Referring MD) Medicines:             General Anesthesia Complications:         No immediate complications. Estimated blood loss: None. Procedure:             Pre-Anesthesia Assessment:                        - Prior to the procedure, a History and Physical was                         performed, and patient medications and allergies were                         reviewed. The patient is competent. The risks and                         benefits of the procedure and the sedation options and                         risks were discussed with the patient. All questions                         were answered and informed consent was obtained.                         Patient identification and proposed procedure were                         verified by the physician, the nurse, the                         anesthesiologist, the anesthetist and the technician                         in the pre-procedure area in the procedure room in the                         endoscopy suite. Mental Status Examination: alert and                         oriented. Airway Examination: normal oropharyngeal                         airway and neck mobility. Respiratory Examination:                         clear to auscultation. CV Examination: normal.  Prophylactic Antibiotics: The patient does not require                         prophylactic antibiotics. Prior Anticoagulants: The                         patient has  taken no anticoagulant or antiplatelet                         agents. ASA Grade Assessment: III - A patient with                         severe systemic disease. After reviewing the risks and                         benefits, the patient was deemed in satisfactory                         condition to undergo the procedure. The anesthesia                         plan was to use general anesthesia. Immediately prior                         to administration of medications, the patient was                         re-assessed for adequacy to receive sedatives. The                         heart rate, respiratory rate, oxygen saturations,                         blood pressure, adequacy of pulmonary ventilation, and                         response to care were monitored throughout the                         procedure. The physical status of the patient was                         re-assessed after the procedure.                        After obtaining informed consent, the colonoscope was                         passed under direct vision. Throughout the procedure,                         the patient's blood pressure, pulse, and oxygen                         saturations were monitored continuously. The                         Colonoscope was introduced through the anus and  advanced to the the cecum, identified by appendiceal                         orifice and ileocecal valve. The colonoscopy was                         unusually difficult due to multiple diverticula in the                         colon, inadequate bowel prep and significant looping.                         Successful completion of the procedure was aided by                         withdrawing the scope and replacing with the pediatric                         colonoscope and applying abdominal pressure. The                         patient tolerated the procedure well. The quality of                          the bowel preparation was fair. The ileocecal valve,                         appendiceal orifice, and rectum were photographed. Findings:      The perianal and digital rectal examinations were normal. Pertinent       negatives include normal sphincter tone and no palpable rectal lesions.      Three sessile polyps were found in the sigmoid colon, descending colon       and transverse colon. The polyps were 4 to 5 mm in size. These polyps       were removed with a cold snare. Resection and retrieval were complete.       Estimated blood loss: none.      Multiple large-mouthed diverticula were found in the entire colon. There       was no evidence of diverticular bleeding.      The retroflexed view of the distal rectum and anal verge was normal and       showed no anal or rectal abnormalities. Impression:            - Preparation of the colon was fair.                        - Three 4 to 5 mm polyps in the sigmoid colon, in the                         descending colon and in the transverse colon, removed                         with a cold snare. Resected and retrieved.                        - Severe diverticulosis in the entire examined colon.  There was no evidence of diverticular bleeding.                        - The distal rectum and anal verge are normal on                         retroflexion view. Recommendation:        - Return patient to hospital ward for ongoing care.                        - Resume regular diet today.                        - Continue present medications.                        - Await pathology results.                        - To visualize the small bowel, perform video capsule                         endoscopy at appointment to be scheduled. Procedure Code(s):     --- Professional ---                        [phone], Colonoscopy, flexible; with removal of                         tumor(s), polyp(s), or other lesion(s) by snare                          technique Diagnosis Code(s):     --- Professional ---                        D12.5, Benign neoplasm of sigmoid colon                        D12.4, Benign neoplasm of descending colon                        D12.3, Benign neoplasm of transverse colon (hepatic                         flexure or splenic flexure)                        D50.9, Iron deficiency anemia, unspecified                        K57.30, Diverticulosis of large intestine without                         perforation or abscess without bleeding CPT copyright 2022 [employer]. All rights reserved. The codes documented in this report are preliminary and upon coder review may  be revised to meet current compliance requirements. Dr. Ulyess Mort Lin Landsman MD, MD 12/30/2022 11:38:10 AM This report has been signed electronically. Number of Addenda: 0 Note Initiated On: 12/30/2022 10:11 AM Scope Withdrawal Time: 0 hours 10 minutes 56 seconds  Total Procedure Duration: 0 hours 31 minutes 24 seconds  Estimated Blood Loss:  Estimated blood loss: none.      [facility]

## 2022-12-30 NOTE — Op Note (Signed)
[facility] Gastroenterology Patient Name: Derek Haney Procedure Date: 12/30/2022 10:12 AM MRN: [id number] Account #: [id number] Date of Birth: 10-Nov-1933 Admit Type: Inpatient Age: 89 Room: [facility] ENDO ROOM 4 Gender: Male Note Status: Finalized Instrument Name: Upper Endoscope [id number] Procedure:             Upper GI endoscopy Indications:           Unexplained iron deficiency anemia Providers:             Lin Landsman MD, MD Referring MD:          Rusty Aus, MD (Referring MD) Medicines:             General Anesthesia Complications:         No immediate complications. Estimated blood loss: None. Procedure:             Pre-Anesthesia Assessment:                        - Prior to the procedure, a History and Physical was                         performed, and patient medications and allergies were                         reviewed. The patient is competent. The risks and                         benefits of the procedure and the sedation options and                         risks were discussed with the patient. All questions                         were answered and informed consent was obtained.                         Patient identification and proposed procedure were                         verified by the physician, the nurse, the                         anesthesiologist, the anesthetist and the technician                         in the pre-procedure area in the procedure room in the                         endoscopy suite. Mental Status Examination: alert and                         oriented. Airway Examination: normal oropharyngeal                         airway and neck mobility. Respiratory Examination:                         clear to auscultation. CV Examination: normal.  Prophylactic Antibiotics: The patient does not require                         prophylactic antibiotics. Prior Anticoagulants: The                          patient has taken no anticoagulant or antiplatelet                         agents. ASA Grade Assessment: III - A patient with                         severe systemic disease. After reviewing the risks and                         benefits, the patient was deemed in satisfactory                         condition to undergo the procedure. The anesthesia                         plan was to use general anesthesia. Immediately prior                         to administration of medications, the patient was                         re-assessed for adequacy to receive sedatives. The                         heart rate, respiratory rate, oxygen saturations,                         blood pressure, adequacy of pulmonary ventilation, and                         response to care were monitored throughout the                         procedure. The physical status of the patient was                         re-assessed after the procedure.                        After obtaining informed consent, the endoscope was                         passed under direct vision. Throughout the procedure,                         the patient's blood pressure, pulse, and oxygen                         saturations were monitored continuously. The Endoscope                         was introduced through the mouth, and advanced to the  second part of duodenum. The upper GI endoscopy was                         accomplished without difficulty. The patient tolerated                         the procedure well. Findings:      The duodenal bulb and second portion of the duodenum were normal.      The entire examined stomach was normal. Biopsies were taken with a cold       forceps for Helicobacter pylori testing.      The cardia and gastric fundus were normal on retroflexion.      Esophagogastric landmarks were identified: the gastroesophageal junction       was found at 45 cm from the incisors.      A  bleeding submucosal tear was found in the lower third of the       esophagus. This measured 10 mm in length. For hemostasis, one hemostatic       clip was successfully placed (MR safe). Clip manufacturer: [employer]. There was no bleeding at the end of the procedure. Impression:            - Normal duodenal bulb and second portion of the                         duodenum.                        - Normal stomach. Biopsied.                        - Esophagogastric landmarks identified.                        - Submucosal tear in the lower third of the esophagus.                         Clip manufacturer: [employer]. Clip (MR safe)                         was placed. Recommendation:        - Await pathology results.                        - Use Prilosec (omeprazole) 40 mg PO daily for 3                         months.                        - Proceed with colonoscopy as scheduled                        See colonoscopy report Procedure Code(s):     --- Professional ---                        [id number], 59, Esophagogastroduodenoscopy, flexible,                         transoral; with control of bleeding, any method  [id number], Esophagogastroduodenoscopy, flexible,                         transoral; with biopsy, single or multiple Diagnosis Code(s):     --- Professional ---                        S27.818A, Other injury of esophagus (thoracic part),                         initial encounter                        D50.9, Iron deficiency anemia, unspecified CPT copyright 2022 American Medical Association. All rights reserved. The codes documented in this report are preliminary and upon coder review may  be revised to meet current compliance requirements. Dr. Ulyess Mort Lin Landsman MD, MD 12/30/2022 11:01:04 AM This report has been signed electronically. Number of Addenda: 0 Note Initiated On: 12/30/2022 10:12 AM Estimated Blood Loss:  Estimated blood  loss: none.      [facility]

## 2022-12-30 NOTE — Anesthesia Preprocedure Evaluation (Signed)
Anesthesia Evaluation  Patient identified by MRN, date of birth, ID band Patient awake    Reviewed: Allergy & Precautions, NPO status , Patient's Chart, lab work & pertinent test results  Airway Mallampati: III  TM Distance: >3 FB Neck ROM: full    Dental  (+) Chipped   Pulmonary shortness of breath and at rest, COPD,  oxygen dependent, former smoker   Pulmonary exam normal        Cardiovascular hypertension, On Home Beta Blockers + CAD, + Past MI and +CHF  Normal cardiovascular exam+ dysrhythmias      Neuro/Psych negative neurological ROS  negative psych ROS   GI/Hepatic Neg liver ROS,GERD  ,,  Endo/Other    Renal/GU negative Renal ROS  negative genitourinary   Musculoskeletal   Abdominal   Peds  Hematology  (+) Blood dyscrasia, anemia   Anesthesia Other Findings Past Medical History: No date: Anemia No date: Cancer ([place])     Comment:  basal cell No date: CHF (congestive heart failure) (HCC) No date: Coronary artery disease No date: Dysrhythmia No date: GERD (gastroesophageal reflux disease) No date: Glaucoma (increased eye pressure) No date: Hypertension 06/19/2018: Moderate COPD (chronic obstructive pulmonary disease)  (HCC) No date: Myocardial infarction [facility]) No date: Wears dentures     Comment:  partial upper  Past Surgical History: 01/17/2022: CATARACT EXTRACTION W/PHACO; Left     Comment:  Procedure: CATARACT EXTRACTION PHACO AND INTRAOCULAR               LENS PLACEMENT ([place]) LEFT MALYUGIN VISION BLUE KAHOOK               DUAL BLADE GONIOTOMY;  Surgeon: Leandrew Koyanagi, MD;              Location: [place];  Service: Ophthalmology;                Laterality: Left;  13.73 01:56.5 No date: CORONARY ANGIOPLASTY No date: TONSILLECTOMY 03/21/2016: TOTAL HIP ARTHROPLASTY; Right     Comment:  Procedure: TOTAL HIP ARTHROPLASTY;  Surgeon: [name], MD;  Location: [facility]  ORS;  Service: Orthopedics;                Laterality: Right;  BMI    Body Mass Index: 27.26 kg/m      Reproductive/Obstetrics negative OB ROS                             Anesthesia Physical Anesthesia Plan  ASA: 3  Anesthesia Plan: General   Post-op Pain Management: Minimal or no pain anticipated   Induction: Intravenous  PONV Risk Score and Plan: Propofol infusion and TIVA  Airway Management Planned: Natural Airway and Nasal Cannula  Additional Equipment:   Intra-op Plan:   Post-operative Plan:   Informed Consent: I have reviewed the patients History and Physical, chart, labs and discussed the procedure including the risks, benefits and alternatives for the proposed anesthesia with the patient or authorized representative who has indicated his/her understanding and acceptance.     Dental Advisory Given  Plan Discussed with: Anesthesiologist, CRNA and Surgeon  Anesthesia Plan Comments: (Patient consented for risks of anesthesia including but not limited to:  - adverse reactions to medications - risk of airway placement if required - damage to eyes, teeth, lips or other oral mucosa - nerve damage due to positioning  -  sore throat or hoarseness - Damage to heart, brain, nerves, lungs, other parts of body or loss of life  Patient voiced understanding.)       Anesthesia Quick Evaluation

## 2022-12-30 NOTE — Care Management CC44 (Signed)
Condition Code 44 Documentation Completed  Patient Details  Name: Famous Speller MRN: [id number] Date of Birth: April 20, 1933   Condition Code 44 given:  Yes Patient signature on Condition Code 44 notice:  Yes Documentation of 2 MD's agreement:  Yes Code 44 added to claim:  Yes    Izola Price, RN 12/30/2022, 3:20 PM

## 2022-12-30 NOTE — Transfer of Care (Signed)
Immediate Anesthesia Transfer of Care Note  Patient: Derek Haney  Procedure(s) Performed: ESOPHAGOGASTRODUODENOSCOPY (EGD) WITH PROPOFOL COLONOSCOPY WITH PROPOFOL  Patient Location: Endoscopy Unit  Anesthesia Type:General  Level of Consciousness: awake, alert , and oriented  Airway & Oxygen Therapy: Patient Spontanous Breathing and Patient connected to nasal cannula oxygen  Post-op Assessment: Report given to RN and Post -op Vital signs reviewed and stable  Post vital signs: Reviewed and stable  Last Vitals:  Vitals Value Taken Time  BP 102/45 12/30/22 1149  Temp    Pulse 66 12/30/22 1149  Resp 18 12/30/22 1149  SpO2 100 % 12/30/22 1149    Last Pain:  Vitals:   12/30/22 1149  TempSrc:   PainSc: 0-No pain         Complications: No notable events documented.

## 2022-12-30 NOTE — Progress Notes (Signed)
   12/30/22 0900  Spiritual Encounters  Type of Visit Initial  Care provided to: Pt and family  Referral source Physician  Reason for visit Advance directives  OnCall Visit Yes   Chaplain responded to [facility] consult to provide assistance with AD. Paperwork given to patient with instructions to let nurse know when/if completed and to then call Chaplain services.

## 2022-12-31 ENCOUNTER — Encounter: Payer: Self-pay | Admitting: Gastroenterology

## 2022-12-31 ENCOUNTER — Other Ambulatory Visit: Payer: Self-pay

## 2022-12-31 ENCOUNTER — Telehealth: Payer: Self-pay

## 2022-12-31 DIAGNOSIS — D509 Iron deficiency anemia, unspecified: Secondary | ICD-10-CM

## 2022-12-31 LAB — BPAM RBC
Blood Product Expiration Date: 202403062359
Blood Product Expiration Date: 202403062359
Blood Product Expiration Date: 202403072359
ISSUE DATE / TIME: 202402100338
ISSUE DATE / TIME: 202402100625
ISSUE DATE / TIME: 202402111336
Unit Type and Rh: 7300
Unit Type and Rh: 7300
Unit Type and Rh: 7300

## 2022-12-31 LAB — TYPE AND SCREEN
ABO/RH(D): B POS
Antibody Screen: NEGATIVE
Unit division: 0
Unit division: 0
Unit division: 0

## 2022-12-31 NOTE — Telephone Encounter (Signed)
Per colonoscopy report patient need to be schedule for a outpatient capsule study. He also needs per Dr. Marius Ditch a 3 month follow up appointment

## 2022-12-31 NOTE — Telephone Encounter (Signed)
Called and got patient schedule for 01/21/2023 with Dr. Marius Ditch. Went over instructions and mailed them. Scheduled follow up appointment

## 2023-01-01 ENCOUNTER — Telehealth: Payer: Self-pay | Admitting: Gastroenterology

## 2023-01-01 LAB — SURGICAL PATHOLOGY

## 2023-01-01 NOTE — Telephone Encounter (Signed)
Pt medical records were sent to [facility] pt info for the last 12 months  labs, office notes, procedure notes,

## 2023-01-03 ENCOUNTER — Telehealth: Payer: Self-pay

## 2023-01-03 NOTE — Telephone Encounter (Signed)
[employer] left  a voicemail on my voicemail. They said they need the patient last EGD report and path report sent to them. They said if we do not have it then a peer to peer will need to be schedule by Friday at 3:00pm with the provider. You need to call her back either way at [phone] and fax the report to [phone]

## 2023-01-07 NOTE — Telephone Encounter (Signed)
You will need to call [employer] and find out what you need to do. If a peer to peer needs to be schedule then you will need to schedule it for Dr. Marius Ditch. She could do it tomorrow after 11:30 to 1:00 or 4:00. Wednesday you will have to ask her because she is leaving early and has a plain to catch. Thursday and Friday she is off

## 2023-01-11 ENCOUNTER — Inpatient Hospital Stay
Admission: EM | Admit: 2023-01-11 | Discharge: 2023-01-14 | DRG: 378 | Disposition: A | Discharge status: Home / Self Care | Payer: Medicare Other | Attending: Internal Medicine | Admitting: Internal Medicine

## 2023-01-11 ENCOUNTER — Other Ambulatory Visit: Payer: Self-pay

## 2023-01-11 DIAGNOSIS — J449 Chronic obstructive pulmonary disease, unspecified: Secondary | ICD-10-CM | POA: Diagnosis not present

## 2023-01-11 DIAGNOSIS — Z880 Allergy status to penicillin: Secondary | ICD-10-CM | POA: Diagnosis not present

## 2023-01-11 DIAGNOSIS — Z85828 Personal history of other malignant neoplasm of skin: Secondary | ICD-10-CM | POA: Diagnosis not present

## 2023-01-11 DIAGNOSIS — K922 Gastrointestinal hemorrhage, unspecified: Secondary | ICD-10-CM | POA: Diagnosis not present

## 2023-01-11 DIAGNOSIS — I110 Hypertensive heart disease with heart failure: Secondary | ICD-10-CM | POA: Diagnosis present

## 2023-01-11 DIAGNOSIS — I2510 Atherosclerotic heart disease of native coronary artery without angina pectoris: Secondary | ICD-10-CM | POA: Diagnosis not present

## 2023-01-11 DIAGNOSIS — E119 Type 2 diabetes mellitus without complications: Secondary | ICD-10-CM | POA: Diagnosis not present

## 2023-01-11 DIAGNOSIS — Z886 Allergy status to analgesic agent status: Secondary | ICD-10-CM | POA: Diagnosis not present

## 2023-01-11 DIAGNOSIS — K921 Melena: Secondary | ICD-10-CM | POA: Diagnosis not present

## 2023-01-11 DIAGNOSIS — Z833 Family history of diabetes mellitus: Secondary | ICD-10-CM

## 2023-01-11 DIAGNOSIS — D649 Anemia, unspecified: Principal | ICD-10-CM | POA: Diagnosis present

## 2023-01-11 DIAGNOSIS — Z9861 Coronary angioplasty status: Secondary | ICD-10-CM

## 2023-01-11 DIAGNOSIS — Z87891 Personal history of nicotine dependence: Secondary | ICD-10-CM | POA: Diagnosis not present

## 2023-01-11 DIAGNOSIS — K219 Gastro-esophageal reflux disease without esophagitis: Secondary | ICD-10-CM | POA: Diagnosis not present

## 2023-01-11 DIAGNOSIS — Z79899 Other long term (current) drug therapy: Secondary | ICD-10-CM

## 2023-01-11 DIAGNOSIS — I5032 Chronic diastolic (congestive) heart failure: Secondary | ICD-10-CM | POA: Diagnosis not present

## 2023-01-11 DIAGNOSIS — D61818 Other pancytopenia: Secondary | ICD-10-CM | POA: Diagnosis not present

## 2023-01-11 DIAGNOSIS — H409 Unspecified glaucoma: Secondary | ICD-10-CM | POA: Diagnosis not present

## 2023-01-11 DIAGNOSIS — Z96641 Presence of right artificial hip joint: Secondary | ICD-10-CM | POA: Diagnosis present

## 2023-01-11 DIAGNOSIS — Z7982 Long term (current) use of aspirin: Secondary | ICD-10-CM

## 2023-01-11 DIAGNOSIS — Z794 Long term (current) use of insulin: Secondary | ICD-10-CM

## 2023-01-11 DIAGNOSIS — E782 Mixed hyperlipidemia: Secondary | ICD-10-CM | POA: Diagnosis present

## 2023-01-11 DIAGNOSIS — Z7951 Long term (current) use of inhaled steroids: Secondary | ICD-10-CM | POA: Diagnosis not present

## 2023-01-11 DIAGNOSIS — I252 Old myocardial infarction: Secondary | ICD-10-CM | POA: Diagnosis not present

## 2023-01-11 DIAGNOSIS — I10 Essential (primary) hypertension: Secondary | ICD-10-CM | POA: Diagnosis present

## 2023-01-11 DIAGNOSIS — E785 Hyperlipidemia, unspecified: Secondary | ICD-10-CM | POA: Diagnosis present

## 2023-01-11 LAB — PREPARE RBC (CROSSMATCH)

## 2023-01-11 LAB — CBC
HCT: 18.2 % — ABNORMAL LOW (ref 39.0–52.0)
Hemoglobin: 6.2 g/dL — ABNORMAL LOW (ref 13.0–17.0)
MCH: 33 pg (ref 26.0–34.0)
MCHC: 34.1 g/dL (ref 30.0–36.0)
MCV: 96.8 fL (ref 80.0–100.0)
Platelets: 145 10*3/uL — ABNORMAL LOW (ref 150–400)
RBC: 1.88 MIL/uL — ABNORMAL LOW (ref 4.22–5.81)
RDW: 15.8 % — ABNORMAL HIGH (ref 11.5–15.5)
WBC: 2.6 10*3/uL — ABNORMAL LOW (ref 4.0–10.5)
nRBC: 1.6 % — ABNORMAL HIGH (ref 0.0–0.2)

## 2023-01-11 LAB — COMPREHENSIVE METABOLIC PANEL
ALT: 12 U/L (ref 0–44)
AST: 18 U/L (ref 15–41)
Albumin: 3.3 g/dL — ABNORMAL LOW (ref 3.5–5.0)
Alkaline Phosphatase: 67 U/L (ref 38–126)
Anion gap: 12 (ref 5–15)
BUN: 22 mg/dL (ref 8–23)
CO2: 20 mmol/L — ABNORMAL LOW (ref 22–32)
Calcium: 9 mg/dL (ref 8.9–10.3)
Chloride: 102 mmol/L (ref 98–111)
Creatinine, Ser: 0.98 mg/dL (ref 0.61–1.24)
GFR, Estimated: >60 mL/min (ref >60)
Glucose, Bld: 204 mg/dL — ABNORMAL HIGH (ref 70–99)
Potassium: 3.7 mmol/L (ref 3.5–5.1)
Sodium: 134 mmol/L — ABNORMAL LOW (ref 135–145)
Total Bilirubin: 1.3 mg/dL — ABNORMAL HIGH (ref 0.3–1.2)
Total Protein: 6.7 g/dL (ref 6.5–8.1)

## 2023-01-11 LAB — GLUCOSE, CAPILLARY: Glucose-Capillary: 125 mg/dL — ABNORMAL HIGH (ref 70–99)

## 2023-01-11 MED ORDER — PANTOPRAZOLE INFUSION (NEW) - SIMPLE MED
8.0000 mg/h | INTRAVENOUS | Status: DC
  Administered 2023-01-11 – 2023-01-12 (×2): 8 mg/h via INTRAVENOUS
  Filled 2023-01-11 (×2): qty 100

## 2023-01-11 MED ORDER — ONDANSETRON HCL 4 MG PO TABS: 4.0000 mg | ORAL_TABLET | Freq: Four times a day (QID) | ORAL | Status: DC | PRN

## 2023-01-11 MED ORDER — UMECLIDINIUM BROMIDE 62.5 MCG/ACT IN AEPB
1.0000 | INHALATION_SPRAY | Freq: Every day | RESPIRATORY_TRACT | Status: DC
  Administered 2023-01-12 – 2023-01-14 (×3): 1 via RESPIRATORY_TRACT
  Filled 2023-01-11: qty 7

## 2023-01-11 MED ORDER — SODIUM CHLORIDE 0.9 % IV SOLN
10.0000 mL/h | Freq: Once | INTRAVENOUS | Status: AC
  Administered 2023-01-11: 10 mL/h via INTRAVENOUS

## 2023-01-11 MED ORDER — MORPHINE SULFATE (PF) 2 MG/ML IV SOLN: 2.0000 mg | INTRAVENOUS | Status: DC | PRN

## 2023-01-11 MED ORDER — ONDANSETRON HCL 4 MG/2ML IJ SOLN: 4.0000 mg | Freq: Four times a day (QID) | INTRAMUSCULAR | Status: DC | PRN

## 2023-01-11 MED ORDER — INSULIN ASPART 100 UNIT/ML IJ SOLN
0.0000 [IU] | Freq: Three times a day (TID) | INTRAMUSCULAR | Status: DC
  Administered 2023-01-12 – 2023-01-13 (×2): 3 [IU] via SUBCUTANEOUS
  Administered 2023-01-13: 4 [IU] via SUBCUTANEOUS
  Administered 2023-01-14: 3 [IU] via SUBCUTANEOUS
  Filled 2023-01-11 (×4): qty 1

## 2023-01-11 MED ORDER — LACTATED RINGERS IV SOLN: INTRAVENOUS | Status: DC

## 2023-01-11 MED ORDER — INSULIN ASPART 100 UNIT/ML IJ SOLN
0.0000 [IU] | Freq: Every day | INTRAMUSCULAR | Status: DC
  Filled 2023-01-11: qty 1

## 2023-01-11 MED ORDER — PANTOPRAZOLE 80MG IVPB - SIMPLE MED
80.0000 mg | Freq: Once | INTRAVENOUS | Status: AC
  Administered 2023-01-11: 80 mg via INTRAVENOUS
  Filled 2023-01-11: qty 100

## 2023-01-11 MED ORDER — PANTOPRAZOLE SODIUM 40 MG IV SOLR: 40.0000 mg | Freq: Two times a day (BID) | INTRAVENOUS | Status: DC

## 2023-01-11 MED ORDER — FLUTICASONE FUROATE-VILANTEROL 100-25 MCG/ACT IN AEPB
1.0000 | INHALATION_SPRAY | Freq: Every day | RESPIRATORY_TRACT | Status: DC
  Administered 2023-01-12 – 2023-01-14 (×3): 1 via RESPIRATORY_TRACT
  Filled 2023-01-11: qty 28

## 2023-01-11 NOTE — ED Triage Notes (Signed)
Pt presents to the ED via POV due to abnormal labs. Pt states he was seen at the doctors office for a follow up due to having 3 units of blood transfused  a few weeks ago. Hemoglobin 6. Pt appears pale in triage. Pt denies CP and NVD. Pt denies blood stool and vomit. Pt c/o increasing SOB on exertion. Pt A&Ox4

## 2023-01-11 NOTE — ED Provider Notes (Signed)
[facility] Provider Note    Event Date/Time   First MD Initiated Contact with Patient 01/11/23 1943     (approximate)   History   Abnormal Lab   HPI  Derek Haney is a 89 y.o. male who presents to the emergency department today after blood work ordered by primary care physician shows anemia.  Patient had recent hospitalization secondary to GI bleed and anemia requiring blood transfusions.  Patient states that since being home he has been feeling weak and short of breath.  He has not noticed any blood or black or tarry stools.  Denies any chest pain.     Physical Exam   Triage Vital Signs: ED Triage Vitals  Enc Vitals Group     BP 01/11/23 1855 (!) 115/52     Pulse Rate 01/11/23 1855 69     Resp 01/11/23 1855 18     Temp 01/11/23 1855 97.9 F (36.6 C)     Temp Source 01/11/23 1855 Oral     SpO2 01/11/23 1855 98 %     Weight 01/11/23 1853 189 lb 15.9 oz (86.2 kg)     Height 01/11/23 1853 '5\' 10"'$  (1.778 m)     Head Circumference --      Peak Flow --      Pain Score 01/11/23 1853 0     Pain Loc --      Pain Edu? --      Excl. in [place]? --     Most recent vital signs: Vitals:   01/11/23 1855  BP: (!) 115/52  Pulse: 69  Resp: 18  Temp: 97.9 F (36.6 C)  SpO2: 98%   General: Awake, alert, oriented. CV:  Good peripheral perfusion. Regular rate and rhythm. Resp:  Normal effort. Lungs clear. Abd:  No distention. Non tender Other:  GUIAC positive.    ED Results / Procedures / Treatments   Labs (all labs ordered are listed, but only abnormal results are displayed) Labs Reviewed  COMPREHENSIVE METABOLIC PANEL - Abnormal; Notable for the following components:      Result Value   Sodium 134 (*)    CO2 20 (*)    Glucose, Bld 204 (*)    Albumin 3.3 (*)    Total Bilirubin 1.3 (*)    All other components within normal limits  CBC - Abnormal; Notable for the following components:   WBC 2.6 (*)    RBC 1.88 (*)    Hemoglobin 6.2 (*)    HCT  18.2 (*)    RDW 15.8 (*)    Platelets 145 (*)    nRBC 1.6 (*)    All other components within normal limits  TYPE AND SCREEN  PREPARE RBC (CROSSMATCH)     EKG  None   RADIOLOGY None   PROCEDURES:  Critical Care performed: Yes, see critical care procedure note(s)  Procedures  CRITICAL CARE Performed by: Nance Pear   Total critical care time: 30 minutes  Critical care time was exclusive of separately billable procedures and treating other patients.  Critical care was necessary to treat or prevent imminent or life-threatening deterioration.  Critical care was time spent personally by me on the following activities: development of treatment plan with patient and/or surrogate as well as nursing, discussions with consultants, evaluation of patient's response to treatment, examination of patient, obtaining history from patient or surrogate, ordering and performing treatments and interventions, ordering and review of laboratory studies, ordering and review of radiographic studies, pulse oximetry  and re-evaluation of patient's condition.   MEDICATIONS ORDERED IN ED: Medications - No data to display   IMPRESSION / MDM / [place] / ED COURSE  I reviewed the triage vital signs and the nursing notes.                              Differential diagnosis includes, but is not limited to, gi bleed, bone marrow disease, lab error  Patient's presentation is most consistent with acute presentation with potential threat to life or bodily function.  The patient is on the cardiac monitor to evaluate for evidence of arrhythmia and/or significant heart rate changes.  Patient presented to the emergency department today after blood work performed by PCP showed anemia.  Patient had recent admission for GI bleed.  On exam here patient is not tachycardic.  No significant hypotension.  Guaiac was positive with brown stool on the glove.  At this time do have concerns for recurrent  GI bleed.  Did consent patient for blood transfusion.  Will additionally start Protonix. Discussed with Dr. Jonelle Sidle with the hospitalist service who will plan on admission.     FINAL CLINICAL IMPRESSION(S) / ED DIAGNOSES   Final diagnoses:  Anemia, unspecified type  Gastrointestinal hemorrhage, unspecified gastrointestinal hemorrhage type      Note:  This document was prepared using Dragon voice recognition software and may include unintentional dictation errors.    Nance Pear, MD 01/11/23 2249

## 2023-01-11 NOTE — H&P (Signed)
History and Physical    Patient: Derek Haney [id number] DOB: 06-23-1933 DOA: 01/11/2023 DOS: the patient was seen and examined on 01/11/2023 PCP: Rusty Aus, MD  Patient coming from: Home  Chief Complaint:  Chief Complaint  Patient presents with   Abnormal Lab   HPI: Derek Haney is a 89 y.o. male with medical history significant of recent GI bleed, COPD on 2 L at night, hypertension, morbid obesity, hyperlipidemia, coronary artery disease, diastolic dysfunction, GERD, diabetes type 2, presenting to the ER with recurrent weakness.  Patient went to his primary care physician for follow-up.  He was here 2 weeks ago with GI bleed.  At which time he had 3 units of packed red blood cells transfused.  Also was seen by GI with subsequent EGD and colonoscopy done.  Patient was discharged home on at that time his hemoglobin was 7.3.  When he was seen in the GI office hemoglobin has dropped to 6.2.  He has pancytopenia with a white count 2.6.  Patient was found to have melena with guaiac positive stool.  Appears to still be having some GI bleed probably slowed bleed.  Is being admitted to the hospital for further evaluation and treatment.  Review of Systems: As mentioned in the history of present illness. All other systems reviewed and are negative. Past Medical History:  Diagnosis Date   Anemia    Cancer ([place])    basal cell   CHF (congestive heart failure) (HCC)    Coronary artery disease    Dysrhythmia    GERD (gastroesophageal reflux disease)    Glaucoma (increased eye pressure)    Hypertension    Moderate COPD (chronic obstructive pulmonary disease) ([place]) 06/19/2018   Myocardial infarction [facility])    Wears dentures    partial upper   Past Surgical History:  Procedure Laterality Date   CATARACT EXTRACTION W/PHACO Left 01/17/2022   Procedure: CATARACT EXTRACTION PHACO AND INTRAOCULAR LENS PLACEMENT ([place]) LEFT MALYUGIN VISION BLUE KAHOOK DUAL BLADE GONIOTOMY;  Surgeon: Leandrew Koyanagi, MD;  Location: [place];  Service: Ophthalmology;  Laterality: Left;  13.73 01:56.5   COLONOSCOPY WITH PROPOFOL N/A 12/30/2022   Procedure: COLONOSCOPY WITH PROPOFOL;  Surgeon: Lin Landsman, MD;  Location: [facility] ENDOSCOPY;  Service: Gastroenterology;  Laterality: N/A;   CORONARY ANGIOPLASTY     ESOPHAGOGASTRODUODENOSCOPY (EGD) WITH PROPOFOL N/A 12/30/2022   Procedure: ESOPHAGOGASTRODUODENOSCOPY (EGD) WITH PROPOFOL;  Surgeon: Lin Landsman, MD;  Location: [facility] ENDOSCOPY;  Service: Gastroenterology;  Laterality: N/A;   TONSILLECTOMY     TOTAL HIP ARTHROPLASTY Right 03/21/2016   Procedure: TOTAL HIP ARTHROPLASTY;  Surgeon: [name], MD;  Location: [facility] ORS;  Service: Orthopedics;  Laterality: Right;   Social History:  reports that he quit smoking about 24 years ago. His smoking use included cigarettes. He has a 15.00 pack-year smoking history. He quit smokeless tobacco use about 24 years ago.  His smokeless tobacco use included chew. He reports that he does not currently use alcohol. He reports that he does not use drugs.  Allergies  Allergen Reactions   Aspirin Other (See Comments)    Occult blood in stool; higher doses- Pt can take small dose coated tablet   Penicillins Rash    Family History  Problem Relation Age of Onset   Diabetes Child     Prior to Admission medications   Medication Sig Start Date End Date Taking? Authorizing Provider  albuterol (PROVENTIL HFA;VENTOLIN HFA) 108 (90 Base) MCG/ACT inhaler Inhale 1-2 puffs into  the lungs every 6 (six) hours as needed for wheezing or shortness of breath. Patient not taking: Reported on 12/29/2022 06/03/18   Wilhelmina Mcardle, MD  ASPIRIN 81 PO Take 1 tablet by mouth daily.    [provider]  atorvastatin (LIPITOR) 20 MG tablet Take 20 mg by mouth daily at 6 PM.    [provider]  bimatoprost (LUMIGAN) 0.01 % SOLN Place 1 drop into both eyes at bedtime.    [provider]   cyanocobalamin 1000 MCG tablet Take 1 tablet (1,000 mcg total) by mouth daily. 12/30/22 03/30/23  Terrilee Croak, MD  cycloSPORINE (RESTASIS) 0.05 % ophthalmic emulsion Place 1 drop into both eyes 2 (two) times daily.    [provider]  dorzolamide (TRUSOPT) 2 % ophthalmic solution Place 1 drop into both eyes 2 (two) times daily.    [provider]  FERGON 240 (27 Fe) MG tablet Take 1 tablet by mouth every morning. 10/30/22   [provider]  ferrous sulfate 325 (65 FE) MG tablet Take 325 mg by mouth daily with breakfast.    [provider]  isosorbide mononitrate (IMDUR) 30 MG 24 hr tablet Take 30 mg by mouth 3 (three) times daily. 12/18/22   [provider]  oxymetazoline (NASAL RELIEF) 0.05 % nasal spray Place 1 spray into both nostrils 2 (two) times daily as needed.    [provider]  pantoprazole (PROTONIX) 40 MG tablet Take 1 tablet (40 mg total) by mouth daily. 12/30/22 03/30/23  Terrilee Croak, MD  Polyethyl Glycol-Propyl Glycol (SYSTANE FREE OP) Apply 2 drops to eye in the morning and at bedtime.    [provider]  potassium chloride SA (K-DUR,KLOR-CON) 20 MEQ tablet Take 20 mEq by mouth daily.    [provider]  propranolol (INDERAL) 20 MG tablet Take 20 mg by mouth 4 (four) times daily.    [provider]  sucralfate (CARAFATE) 1 g tablet Take 1 g by mouth 4 (four) times daily.    [provider]  traZODone (DESYREL) 50 MG tablet Take 100 mg by mouth at bedtime.    [provider]  Donnal Debar 100-62.5-25 MCG/INH AEPB INHALE 1 PUFFS BY MOUTH ONCE DAILY 04/08/19   Wilhelmina Mcardle, MD    Physical Exam: Vitals:   01/11/23 1853 01/11/23 1855  BP:  (!) 115/52  Pulse:  69  Resp:  18  Temp:  97.9 F (36.6 C)  TempSrc:  Oral  SpO2:  98%  Weight: 86.2 kg   Height: '5\' 10"'$  (1.778 m)    Constitutional: Acutely ill looking no distress NAD, calm, comfortable Eyes: PERRL, lids and  conjunctivae pale ENMT: Mucous membranes are moist. Posterior pharynx clear of any exudate or lesions.Normal dentition.  Neck: normal, supple, no masses, no thyromegaly Respiratory: clear to auscultation bilaterally, no wheezing, no crackles. Normal respiratory effort. No accessory muscle use.  Cardiovascular: Regular rate and rhythm, no murmurs / rubs / gallops. No extremity edema. 2+ pedal pulses. No carotid bruits.  Abdomen: no tenderness, no masses palpated. No hepatosplenomegaly. Bowel sounds positive.  Musculoskeletal: Good range of motion, no joint swelling or tenderness, Skin: no rashes, lesions, ulcers. No induration Neurologic: CN 2-12 grossly intact. Sensation intact, DTR normal. Strength 5/5 in all 4.  Psychiatric: Normal judgment and insight. Alert and oriented x 3. Normal mood  Data Reviewed:  White count is 2.6, hemoglobin 6.2 and platelets 145.  Sodium 134, CO2 20 glucose 204 total bili 1.3.  Assessment  and Plan:   #1 recurrent GI bleed: Patient has had recent EGD and colonoscopy.  He was seen by Dr. Marius Ditch.  EGD was normal and biopsies were taken.  Patient had colonoscopy that showed multiple diverticuli but no bleed.  Some polyps were removed.  Patient was discharged home and now he is back with low hemoglobin.  Suspect slow diverticular bleed.  Patient will be admitted.  Transfused 2 units of packed red blood cell.  Serial H&H.  GI consult in the morning.  IV Protonix.  #2 symptomatic anemia with pancytopenia: Transfuse as above.  #3 diabetes: Sliding scale insulin.  Type 2 diabetes.  #4 essential hypertension: Patient will be NPO.  Treat symptomatically.  #5 morbid obesity: Dietary counseling.  #6 coronary artery disease: Needs hemoglobin closer to 10 g due to his coronary artery disease.  Currently stable.  #7 chronic diastolic heart failure: Stable.  #8 GERD: Continue with PPIs  #9 COPD: No acute exacerbation.  Patient uses oxygen mainly at night.  Empiric  oxygen.  #10 hyperlipidemia: Will resume home regimen when oral intake resumes.     Advance Care Planning:   Code Status: Full Code   Consults: Dr. Marius Ditch  Family Communication: No family at bedside  Severity of Illness: The appropriate patient status for this patient is INPATIENT. Inpatient status is judged to be reasonable and necessary in order to provide the required intensity of service to ensure the patient's safety. The patient's presenting symptoms, physical exam findings, and initial radiographic and laboratory data in the context of their chronic comorbidities is felt to place them at high risk for further clinical deterioration. Furthermore, it is not anticipated that the patient will be medically stable for discharge from the hospital within 2 midnights of admission.   * I certify that at the point of admission it is my clinical judgment that the patient will require inpatient hospital care spanning beyond 2 midnights from the point of admission due to high intensity of service, high risk for further deterioration and high frequency of surveillance required.*  AuthorBarbette Merino, MD 01/11/2023 9:21 PM  For on call review [email or web address].

## 2023-01-12 ENCOUNTER — Encounter: Payer: Self-pay | Admitting: Internal Medicine

## 2023-01-12 DIAGNOSIS — K921 Melena: Secondary | ICD-10-CM | POA: Diagnosis not present

## 2023-01-12 DIAGNOSIS — K922 Gastrointestinal hemorrhage, unspecified: Secondary | ICD-10-CM | POA: Diagnosis not present

## 2023-01-12 LAB — COMPREHENSIVE METABOLIC PANEL
ALT: 11 U/L (ref 0–44)
AST: 17 U/L (ref 15–41)
Albumin: 3 g/dL — ABNORMAL LOW (ref 3.5–5.0)
Alkaline Phosphatase: 58 U/L (ref 38–126)
Anion gap: 8 (ref 5–15)
BUN: 20 mg/dL (ref 8–23)
CO2: 22 mmol/L (ref 22–32)
Calcium: 8.5 mg/dL — ABNORMAL LOW (ref 8.9–10.3)
Chloride: 107 mmol/L (ref 98–111)
Creatinine, Ser: 0.77 mg/dL (ref 0.61–1.24)
GFR, Estimated: >60 mL/min (ref >60)
Glucose, Bld: 105 mg/dL — ABNORMAL HIGH (ref 70–99)
Potassium: 3.3 mmol/L — ABNORMAL LOW (ref 3.5–5.1)
Sodium: 137 mmol/L (ref 135–145)
Total Bilirubin: 2.9 mg/dL — ABNORMAL HIGH (ref 0.3–1.2)
Total Protein: 5.8 g/dL — ABNORMAL LOW (ref 6.5–8.1)

## 2023-01-12 LAB — CBC
HCT: 21.7 % — ABNORMAL LOW (ref 39.0–52.0)
Hemoglobin: 7.4 g/dL — ABNORMAL LOW (ref 13.0–17.0)
MCH: 31.2 pg (ref 26.0–34.0)
MCHC: 34.1 g/dL (ref 30.0–36.0)
MCV: 91.6 fL (ref 80.0–100.0)
Platelets: 133 10*3/uL — ABNORMAL LOW (ref 150–400)
RBC: 2.37 MIL/uL — ABNORMAL LOW (ref 4.22–5.81)
RDW: 16.3 % — ABNORMAL HIGH (ref 11.5–15.5)
WBC: 2.9 10*3/uL — ABNORMAL LOW (ref 4.0–10.5)
nRBC: 1.4 % — ABNORMAL HIGH (ref 0.0–0.2)

## 2023-01-12 LAB — TYPE AND SCREEN
ABO/RH(D): B POS
Antibody Screen: NEGATIVE
Unit division: 0
Unit division: 0

## 2023-01-12 LAB — BPAM RBC
Blood Product Expiration Date: 202403082359
Blood Product Expiration Date: 202403202359
ISSUE DATE / TIME: 202402232139
ISSUE DATE / TIME: 202402232337
Unit Type and Rh: 7300
Unit Type and Rh: 7300

## 2023-01-12 LAB — GLUCOSE, CAPILLARY
Glucose-Capillary: 110 mg/dL — ABNORMAL HIGH (ref 70–99)
Glucose-Capillary: 97 mg/dL (ref 70–99)
Glucose-Capillary: 125 mg/dL — ABNORMAL HIGH (ref 70–99)
Glucose-Capillary: 137 mg/dL — ABNORMAL HIGH (ref 70–99)

## 2023-01-12 LAB — HEMOGLOBIN A1C
Hgb A1c MFr Bld: 6.1 % — ABNORMAL HIGH (ref 4.8–5.6)
Mean Plasma Glucose: 128.37 mg/dL

## 2023-01-12 MED ORDER — BIMATOPROST 0.01 % OP SOLN
1.0000 [drp] | Freq: Every day | OPHTHALMIC | Status: DC
  Administered 2023-01-12 – 2023-01-13 (×2): 1 [drp] via OPHTHALMIC
  Filled 2023-01-12: qty 2.5

## 2023-01-12 MED ORDER — DORZOLAMIDE HCL 2 % OP SOLN
1.0000 [drp] | Freq: Two times a day (BID) | OPHTHALMIC | Status: DC
  Administered 2023-01-12 – 2023-01-14 (×5): 1 [drp] via OPHTHALMIC
  Filled 2023-01-12: qty 10

## 2023-01-12 MED ORDER — DORZOLAMIDE HCL 2 % OP SOLN
1.0000 [drp] | Freq: Two times a day (BID) | OPHTHALMIC | Status: DC
  Filled 2023-01-12: qty 10

## 2023-01-12 MED ORDER — NON FORMULARY: 1.0000 [drp] | Freq: Every day | Status: DC

## 2023-01-12 MED ORDER — SUCRALFATE 1 G PO TABS
1.0000 g | ORAL_TABLET | Freq: Four times a day (QID) | ORAL | Status: DC
  Administered 2023-01-12 – 2023-01-14 (×8): 1 g via ORAL
  Filled 2023-01-12 (×9): qty 1

## 2023-01-12 MED ORDER — POLYETHYL GLYCOL-PROPYL GLYCOL 0.4-0.3 % OP SOLN
1.0000 [drp] | Freq: Two times a day (BID) | OPHTHALMIC | Status: DC
  Administered 2023-01-12 – 2023-01-14 (×5): 1 [drp] via OPHTHALMIC
  Filled 2023-01-12: qty 1

## 2023-01-12 MED ORDER — VITAMIN B-12 1000 MCG PO TABS
1000.0000 ug | ORAL_TABLET | Freq: Every day | ORAL | Status: DC
  Administered 2023-01-13: 1000 ug via ORAL
  Filled 2023-01-12: qty 1

## 2023-01-12 MED ORDER — CYCLOSPORINE 0.05 % OP EMUL
1.0000 [drp] | Freq: Two times a day (BID) | OPHTHALMIC | Status: DC
  Filled 2023-01-12: qty 30

## 2023-01-12 MED ORDER — PANTOPRAZOLE SODIUM 40 MG PO TBEC
40.0000 mg | DELAYED_RELEASE_TABLET | Freq: Two times a day (BID) | ORAL | Status: DC
  Administered 2023-01-12 – 2023-01-13 (×3): 40 mg via ORAL
  Filled 2023-01-12 (×4): qty 1

## 2023-01-12 NOTE — Consult Note (Signed)
Consultation  Referring Provider:    Hospitalist  Admit date: 01/11/2023 Consult date: 01/12/2023         Reason for Consultation:     Anemia         HPI:   Derek Haney is a 89 y.o. gentleman with history of COPD on 2l, HFpEF, and hypertension who presented with low hemoglobin. Was recently hospitalized for IDA without overt GI bleeding, at discharge his hemoglobin was 7.3. He denies any overt GI bleeding now. He underwent EGD/Colonoscopy during previous hospitalization which showed possible mallory weiss tear and diverticulosis on colonoscopy. He has occasional nose bleeds. Interestingly, on presentation at his last hospitalization he was not iron deficient but was b12 deficient.   Past Medical History:  Diagnosis Date   Anemia    Cancer ([place])    basal cell   CHF (congestive heart failure) (HCC)    Coronary artery disease    Dysrhythmia    GERD (gastroesophageal reflux disease)    Glaucoma (increased eye pressure)    Hypertension    Moderate COPD (chronic obstructive pulmonary disease) ([place]) 06/19/2018   Myocardial infarction [facility])    Wears dentures    partial upper    Past Surgical History:  Procedure Laterality Date   CATARACT EXTRACTION W/PHACO Left 01/17/2022   Procedure: CATARACT EXTRACTION PHACO AND INTRAOCULAR LENS PLACEMENT ([place]) LEFT MALYUGIN VISION BLUE KAHOOK DUAL BLADE GONIOTOMY;  Surgeon: Leandrew Koyanagi, MD;  Location: [place];  Service: Ophthalmology;  Laterality: Left;  13.73 01:56.5   COLONOSCOPY WITH PROPOFOL N/A 12/30/2022   Procedure: COLONOSCOPY WITH PROPOFOL;  Surgeon: Lin Landsman, MD;  Location: [facility] ENDOSCOPY;  Service: Gastroenterology;  Laterality: N/A;   CORONARY ANGIOPLASTY     ESOPHAGOGASTRODUODENOSCOPY (EGD) WITH PROPOFOL N/A 12/30/2022   Procedure: ESOPHAGOGASTRODUODENOSCOPY (EGD) WITH PROPOFOL;  Surgeon: Lin Landsman, MD;  Location: [facility] ENDOSCOPY;  Service: Gastroenterology;  Laterality: N/A;   TONSILLECTOMY      TOTAL HIP ARTHROPLASTY Right 03/21/2016   Procedure: TOTAL HIP ARTHROPLASTY;  Surgeon: [name], MD;  Location: [facility] ORS;  Service: Orthopedics;  Laterality: Right;    Family History  Problem Relation Age of Onset   Diabetes Child     Social History   Tobacco Use   Smoking status: Former    Packs/day: 1.00    Years: 15.00    Total pack years: 15.00    Types: Cigarettes    Quit date: 2000    Years since quitting: 24.1   Smokeless tobacco: Former    Types: Chew    Quit date: 2000   Tobacco comments:    it's been over 35 since he quit  Vaping Use   Vaping Use: Never used  Substance Use Topics   Alcohol use: Not Currently    Comment: occ   Drug use: No    Prior to Admission medications   Medication Sig Start Date End Date Taking? Authorizing Provider  albuterol (PROVENTIL HFA;VENTOLIN HFA) 108 (90 Base) MCG/ACT inhaler Inhale 1-2 puffs into the lungs every 6 (six) hours as needed for wheezing or shortness of breath. 06/03/18  Yes Wilhelmina Mcardle, MD  ASPIRIN 81 PO Take 1 tablet by mouth daily.   Yes [provider]  atorvastatin (LIPITOR) 20 MG tablet Take 20 mg by mouth daily at 6 PM.   Yes [provider]  bimatoprost (LUMIGAN) 0.01 % SOLN Place 1 drop into both eyes at bedtime.   Yes [provider]  cyanocobalamin 1000 MCG tablet Take  1 tablet (1,000 mcg total) by mouth daily. 12/30/22 03/30/23 Yes Dahal, Marlowe Aschoff, MD  dorzolamide (TRUSOPT) 2 % ophthalmic solution Place 1 drop into both eyes 2 (two) times daily.   Yes [provider]  famotidine (PEPCID) 40 MG tablet Take 1 tablet by mouth at bedtime. 01/11/23 01/11/24 Yes [provider]  ferrous sulfate 325 (65 FE) MG tablet Take 325 mg by mouth daily with breakfast.   Yes [provider]  isosorbide mononitrate (IMDUR) 30 MG 24 hr tablet Take 30 mg by mouth 3 (three) times daily. 12/18/22  Yes [provider]  oxymetazoline (NASAL RELIEF) 0.05 % nasal spray  Place 1 spray into both nostrils 2 (two) times daily as needed.   Yes [provider]  pantoprazole (PROTONIX) 40 MG tablet Take 1 tablet (40 mg total) by mouth daily. 12/30/22 03/30/23 Yes Dahal, Marlowe Aschoff, MD  Polyethyl Glycol-Propyl Glycol (SYSTANE FREE OP) Apply 2 drops to eye in the morning and at bedtime.   Yes [provider]  potassium chloride SA (K-DUR,KLOR-CON) 20 MEQ tablet Take 20 mEq by mouth daily.   Yes [provider]  propranolol (INDERAL) 20 MG tablet Take 20 mg by mouth 4 (four) times daily.   Yes [provider]  sucralfate (CARAFATE) 1 g tablet Take 1 g by mouth 4 (four) times daily.   Yes [provider]  traZODone (DESYREL) 50 MG tablet Take 100 mg by mouth at bedtime.   Yes [provider]  TRELEGY ELLIPTA 100-62.5-25 MCG/INH AEPB INHALE 1 PUFFS BY MOUTH ONCE DAILY 04/08/19  Yes Wilhelmina Mcardle, MD  cycloSPORINE (RESTASIS) 0.05 % ophthalmic emulsion Place 1 drop into both eyes 2 (two) times daily.    [provider]  FERGON 240 (27 Fe) MG tablet Take 1 tablet by mouth every morning. Patient not taking: Reported on 01/12/2023 10/30/22   [provider]    Current Facility-Administered Medications  Medication Dose Route Frequency Provider Last Rate Last Admin   bimatoprost (LUMIGAN) 0.01 % ophthalmic solution 1 drop  1 drop Both Eyes QHS Enzo Bi, MD       dorzolamide (TRUSOPT) 2 % ophthalmic solution 1 drop  1 drop Both Eyes BID Enzo Bi, MD   1 drop at 01/12/23 1250   fluticasone furoate-vilanterol (BREO ELLIPTA) 100-25 MCG/ACT 1 puff  1 puff Inhalation Daily Elwyn Reach, MD   1 puff at 01/12/23 [id number]   And   umeclidinium bromide (INCRUSE ELLIPTA) 62.5 MCG/ACT 1 puff  1 puff Inhalation Daily Gala Romney L, MD   1 puff at 01/12/23 0838   insulin aspart (novoLOG) injection 0-20 Units  0-20 Units Subcutaneous TID WC Elwyn Reach, MD   3 Units at 01/12/23 1704   insulin aspart (novoLOG)  injection 0-5 Units  0-5 Units Subcutaneous QHS Jonelle Sidle, Mohammad L, MD       ondansetron (ZOFRAN) tablet 4 mg  4 mg Oral Q6H PRN Elwyn Reach, MD       Or   ondansetron (ZOFRAN) injection 4 mg  4 mg Intravenous Q6H PRN Elwyn Reach, MD       [START ON 01/15/2023] pantoprazole (PROTONIX) injection 40 mg  40 mg Intravenous Q12H Nance Pear, MD       Polyethyl Glycol-Propyl Glycol 0.4-0.3 % SOLN 1 drop  1 drop Ophthalmic BID Enzo Bi, MD   1 drop at 01/12/23 1250   sucralfate (CARAFATE) tablet 1 g  1 g Oral QID Enzo Bi, MD  1 g at 01/12/23 1704    Allergies as of 01/11/2023 - Review Complete 01/11/2023  Allergen Reaction Noted   Aspirin Other (See Comments) 06/10/2012   Penicillins Rash 03/07/2016     Review of Systems:    All systems reviewed and negative except where noted in HPI.  Review of Systems  Constitutional:  Negative for chills and fever.  Respiratory:  Positive for shortness of breath.   Cardiovascular:  Negative for chest pain.  Gastrointestinal:  Negative for abdominal pain, blood in stool, constipation, diarrhea, melena, nausea and vomiting.  Musculoskeletal:  Positive for joint pain.  Skin:  Negative for rash.  Neurological:  Negative for focal weakness.  Psychiatric/Behavioral:  Negative for substance abuse.   All other systems reviewed and are negative.      Physical Exam:  Vital signs in last 24 hours: Temp:  [97.6 F (36.4 C)-98.7 F (37.1 C)] 98.6 F (37 C) (02/24 1522) Pulse Rate:  [65-72] 65 (02/24 1522) Resp:  [18-20] 18 (02/24 1522) BP: (109-135)/(47-69) 123/60 (02/24 1522) SpO2:  [89 %-100 %] 95 % (02/24 1522) Weight:  [86.2 kg] 86.2 kg (02/23 1853)   General:   Pleasant in NAD Head:  Normocephalic and atraumatic. Eyes:   No icterus.   Conjunctiva pink. Mouth: Mucosa pink moist, no lesions. Neck:  Supple; no masses felt Lungs:  No respiratory distress Abdomen:   Flat, soft, nondistended, nontender Msk: No clubbing or  cyanosis Neurologic:  Alert and  oriented x4;  no focal deficits Skin:  Warm, dry, pink without significant lesions or rashes. Psych:  Alert and cooperative. Normal affect.  LAB RESULTS: Recent Labs    01/11/23 1858 01/12/23 0453  WBC 2.6* 2.9*  HGB 6.2* 7.4*  HCT 18.2* 21.7*  PLT 145* 133*   BMET Recent Labs    01/11/23 1858 01/12/23 0453  NA 134* 137  K 3.7 3.3*  CL 102 107  CO2 20* 22  GLUCOSE 204* 105*  BUN 22 20  CREATININE 0.98 0.77  CALCIUM 9.0 8.5*   LFT Recent Labs    01/12/23 0453  PROT 5.8*  ALBUMIN 3.0*  AST 17  ALT 11  ALKPHOS 58  BILITOT 2.9*   PT/INR No results for input(s): "LABPROT", "INR" in the last 72 hours.  STUDIES: No results found.     Impression / Plan:   89 y/o gentleman with history of COPD on 2l, HFpEF, and hypertension who presented with low hemoglobin and negative EGD/Colon about 1-2 weeks ago. Initially looks like was non iron deficient (although hard to tell when the initial iron studies were drawn compared to his blood transfusion)  - recommend monitoring for any overt GI bleeding - will check hemolysis labs - consider RUQ given elevated bilirubin (could be hemolysis) - transfuse to keep hemoglobin > 7 - plan was to perform VCE as outpatient, if hemoglobin is not stable we could consider as an inpatient but likely will continue with plan to do as an outpatient - can switch to PO PPI  Will continue to follow, please call with any questions or concerns.  Raylene Miyamoto MD, MPH [place]

## 2023-01-12 NOTE — Progress Notes (Signed)
  PROGRESS NOTE    Derek Haney  [id number] DOB: January 13, 1933 DOA: 01/11/2023 PCP: Rusty Aus, MD  [id number]  LOS: 1 day   Brief hospital course:   Assessment & Plan: Derek Haney is a 89 y.o. male with medical history significant of recent GI bleed, COPD on 2 L at night, hypertension, morbid obesity, coronary artery disease, diastolic dysfunction, GERD, diabetes type 2, presenting to the ER with Hgb 6.2.  Pt was recently hospitalized for IDA without overt GI bleeding, at discharge on 12/30/22 his hemoglobin was 7.3. He denied any overt GI bleeding now.  He underwent EGD/Colonoscopy on 12/31/22 during previous hospitalization which showed possible mallory weiss tear and diverticulosis on colonoscopy.    Chronic anemia  --Hgb 6.2 on presentation, at discharge on 12/30/22 his hemoglobin was 7.3.  unclear whether having active GI bleed.  Already had recent EGD and colonoscopy with no active bleeding source.  Iron profile during last hospitalization showed no iron def (lab drawn after 1st unit of pRBC started transfusing).  Vit B12 248. --s/p 2u pRBC so far during this hospitalization. Plan: --GI consult today --monitor Hgb and transfuse to keep Hgb >7 - plan was to perform VCE as outpatient, if hemoglobin is not stable we could consider as an inpatient but likely will continue with plan to do as an outpatient  --oral PPI --cont home B12 supplement  Type 2 diabetes --SSI   #4 essential hypertension:  --BP has been wnl without BP meds on hold   #5 morbid obesity, ruled out Overweight, BMI 27   #6 coronary artery disease:  --hold home ASA for now --resume statin after discharge   #7 chronic diastolic heart failure: Stable.   #8 GERD:  Continue with oral PPI   #9 COPD:  No acute exacerbation.  Patient uses oxygen mainly at night.     #10 hyperlipidemia:  --resume statin after discharge  Glaucoma --cont home eye drops (pt wants to use home supplies)   DVT  prophylaxis: SCD/Compression stockings Code Status: Full code  Family Communication:  Level of care: Telemetry Medical Dispo:   The patient is from: home Anticipated d/c is to: home Anticipated d/c date is: 1-2 days Patient currently is not medically ready to d/c due to: pending GI workup and Hgb stability   Subjective and Interval History:  Pt denied pain.  No blood per rectum.   Objective: Vitals:   01/12/23 0254 01/12/23 0459 01/12/23 0735 01/12/23 1522  BP: 135/69 (!) 109/51 (!) 113/51 123/60  Pulse: 71 68 68 65  Resp:  18 18 18  $ Temp:   98.6 F (37 C) 98.6 F (37 C)  TempSrc:   Oral Oral  SpO2: 98% 95% 94% 95%  Weight:      Height:        Intake/Output Summary (Last 24 hours) at 01/12/2023 1831 Last data filed at 01/12/2023 1655 Gross per 24 hour  Intake 807.62 ml  Output --  Net 807.62 ml   Filed Weights   01/11/23 1853  Weight: 86.2 kg    Examination:   Constitutional: NAD, AAOx3 HEENT: conjunctivae and lids normal, EOMI CV: No cyanosis.   RESP: normal respiratory effort, on RA Neuro: II - XII grossly intact.     Data Reviewed: I have personally reviewed labs and imaging studies  Time spent: 50 minutes  Enzo Bi, MD [facility] If 7PM-7AM, please contact night-coverage 01/12/2023, 6:31 PM

## 2023-01-13 DIAGNOSIS — D649 Anemia, unspecified: Secondary | ICD-10-CM | POA: Diagnosis not present

## 2023-01-13 DIAGNOSIS — I2510 Atherosclerotic heart disease of native coronary artery without angina pectoris: Secondary | ICD-10-CM | POA: Diagnosis not present

## 2023-01-13 DIAGNOSIS — K219 Gastro-esophageal reflux disease without esophagitis: Secondary | ICD-10-CM

## 2023-01-13 DIAGNOSIS — D61818 Other pancytopenia: Secondary | ICD-10-CM | POA: Diagnosis not present

## 2023-01-13 DIAGNOSIS — K922 Gastrointestinal hemorrhage, unspecified: Secondary | ICD-10-CM | POA: Diagnosis not present

## 2023-01-13 DIAGNOSIS — K921 Melena: Secondary | ICD-10-CM | POA: Diagnosis not present

## 2023-01-13 LAB — COMPREHENSIVE METABOLIC PANEL
ALT: 11 U/L (ref 0–44)
AST: 15 U/L (ref 15–41)
Albumin: 3.3 g/dL — ABNORMAL LOW (ref 3.5–5.0)
Alkaline Phosphatase: 60 U/L (ref 38–126)
Anion gap: 6 (ref 5–15)
BUN: 11 mg/dL (ref 8–23)
CO2: 22 mmol/L (ref 22–32)
Calcium: 8.6 mg/dL — ABNORMAL LOW (ref 8.9–10.3)
Chloride: 106 mmol/L (ref 98–111)
Creatinine, Ser: 0.71 mg/dL (ref 0.61–1.24)
GFR, Estimated: >60 mL/min (ref >60)
Glucose, Bld: 115 mg/dL — ABNORMAL HIGH (ref 70–99)
Potassium: 3.3 mmol/L — ABNORMAL LOW (ref 3.5–5.1)
Sodium: 134 mmol/L — ABNORMAL LOW (ref 135–145)
Total Bilirubin: 2 mg/dL — ABNORMAL HIGH (ref 0.3–1.2)
Total Protein: 6.3 g/dL — ABNORMAL LOW (ref 6.5–8.1)

## 2023-01-13 LAB — CBC
HCT: 21 % — ABNORMAL LOW (ref 39.0–52.0)
Hemoglobin: 7.3 g/dL — ABNORMAL LOW (ref 13.0–17.0)
MCH: 31.5 pg (ref 26.0–34.0)
MCHC: 34.8 g/dL (ref 30.0–36.0)
MCV: 90.5 fL (ref 80.0–100.0)
Platelets: 126 10*3/uL — ABNORMAL LOW (ref 150–400)
RBC: 2.32 MIL/uL — ABNORMAL LOW (ref 4.22–5.81)
RDW: 16.3 % — ABNORMAL HIGH (ref 11.5–15.5)
WBC: 2.2 10*3/uL — ABNORMAL LOW (ref 4.0–10.5)
nRBC: 1.8 % — ABNORMAL HIGH (ref 0.0–0.2)

## 2023-01-13 LAB — DAT, POLYSPECIFIC AHG (ARMC ONLY): Polyspecific AHG test: NEGATIVE

## 2023-01-13 LAB — BASIC METABOLIC PANEL
Anion gap: 6 (ref 5–15)
BUN: 12 mg/dL (ref 8–23)
CO2: 21 mmol/L — ABNORMAL LOW (ref 22–32)
Calcium: 8.6 mg/dL — ABNORMAL LOW (ref 8.9–10.3)
Chloride: 107 mmol/L (ref 98–111)
Creatinine, Ser: 0.72 mg/dL (ref 0.61–1.24)
GFR, Estimated: >60 mL/min (ref >60)
Glucose, Bld: 137 mg/dL — ABNORMAL HIGH (ref 70–99)
Potassium: 3.1 mmol/L — ABNORMAL LOW (ref 3.5–5.1)
Sodium: 134 mmol/L — ABNORMAL LOW (ref 135–145)

## 2023-01-13 LAB — GLUCOSE, CAPILLARY
Glucose-Capillary: 110 mg/dL — ABNORMAL HIGH (ref 70–99)
Glucose-Capillary: 152 mg/dL — ABNORMAL HIGH (ref 70–99)
Glucose-Capillary: 137 mg/dL — ABNORMAL HIGH (ref 70–99)
Glucose-Capillary: 139 mg/dL — ABNORMAL HIGH (ref 70–99)

## 2023-01-13 LAB — BILIRUBIN, FRACTIONATED(TOT/DIR/INDIR)
Bilirubin, Direct: 0.3 mg/dL — ABNORMAL HIGH (ref 0.0–0.2)
Indirect Bilirubin: 1.5 mg/dL — ABNORMAL HIGH (ref 0.3–0.9)
Total Bilirubin: 1.8 mg/dL — ABNORMAL HIGH (ref 0.3–1.2)

## 2023-01-13 LAB — PROTIME-INR
INR: 1.3 — ABNORMAL HIGH (ref 0.8–1.2)
Prothrombin Time: 15.6 seconds — ABNORMAL HIGH (ref 11.4–15.2)

## 2023-01-13 LAB — LACTATE DEHYDROGENASE: LDH: 302 U/L — ABNORMAL HIGH (ref 98–192)

## 2023-01-13 LAB — MAGNESIUM: Magnesium: 1.8 mg/dL (ref 1.7–2.4)

## 2023-01-13 MED ORDER — PROPRANOLOL HCL 10 MG PO TABS: 20.0000 mg | ORAL_TABLET | Freq: Four times a day (QID) | ORAL | Status: DC

## 2023-01-13 MED ORDER — ATORVASTATIN CALCIUM 20 MG PO TABS
20.0000 mg | ORAL_TABLET | Freq: Every day | ORAL | Status: DC
  Administered 2023-01-13: 20 mg via ORAL
  Filled 2023-01-13: qty 1

## 2023-01-13 NOTE — Progress Notes (Signed)
[facility] [place] NAME: Derek Haney    MR#:  [id number]  DATE OF BIRTH:  May 26, 1933  SUBJECTIVE:  no family at bedside. Patient denies any vomiting or any bright red blood per rectum. Overall remains stable. Tolerating PO diet.    VITALS:  Blood pressure (!) 140/64, pulse 76, temperature 98.2 F (36.8 C), resp. rate 18, height '5\' 10"'$  (1.778 m), weight 86.2 kg, SpO2 100 %.  PHYSICAL EXAMINATION:   GENERAL:  89 y.o.-year-old patient with no acute distress.  LUNGS: Normal breath sounds bilaterally, no wheezing CARDIOVASCULAR: S1, S2 normal. No murmur   ABDOMEN: Soft, nontender, nondistended. Bowel sounds present.  EXTREMITIES: No  edema b/l.    NEUROLOGIC: nonfocal  patient is alert and awake SKIN: No obvious rash, lesion, or ulcer.   LABORATORY PANEL:  CBC Recent Labs  Lab 01/13/23 0413  WBC 2.2*  HGB 7.3*  HCT 21.0*  PLT 126*    Chemistries  Recent Labs  Lab 01/13/23 0413 01/13/23 0821  NA 134* 134*  K 3.1* 3.3*  CL 107 106  CO2 21* 22  GLUCOSE 137* 115*  BUN 12 11  CREATININE 0.72 0.71  CALCIUM 8.6* 8.6*  MG 1.8  --   AST  --  15  ALT  --  11  ALKPHOS  --  60  BILITOT 1.8* 2.0*      Assessment & Plan: Derek Haney is a 89 y.o. male with medical history significant of recent GI bleed, COPD on 2 L at night, hypertension, morbid obesity, coronary artery disease, diastolic dysfunction, GERD, diabetes type 2, presenting to the ER with Hgb 6.2.   Pt was recently hospitalized for IDA without overt GI bleeding, at discharge on 12/30/22 his hemoglobin was 7.3. He denied any overt GI bleeding now.  He underwent EGD/Colonoscopy on 12/31/22 during previous hospitalization which showed possible mallory weiss tear and diverticulosis on colonoscopy.    Chronic anemia  --Hgb 6.2 on presentation, at discharge on 12/30/22 his hemoglobin was 7.3.  unclear whether having active GI bleed.  Already had recent EGD and  colonoscopy with no active bleeding source.  Iron profile during last hospitalization showed no iron def (lab drawn after 1st unit of pRBC started transfusing).  Vit B12 248. --s/p 2u pRBC so far during this hospitalization. --GI consult with Dr. Haig Prophet. Recommends hematology to see patient given chronic anemia with no source of bleeding noted. Video capsule endoscopy to be done as outpatient. --oral PPI --cont home B12 supplement -- patient to be seen by Dr. Janese Banks-- hematology oncology. Patient likely will get bone marrow biopsy tomorrow by IR as per discussion with oncology   Type 2 diabetes --SSI   essential hypertension:  --BP has been wnl without BP meds on hold    morbid obesity, ruled out Overweight, BMI 27    coronary artery disease:  -- Will resume ASA for now --resume statin after discharge    chronic diastolic heart failure: Stable.   GERD:  Continue with oral PPI    COPD:  No acute exacerbation.  Patient uses oxygen mainly at night.     hyperlipidemia:  --resume statin after discharge   Glaucoma --cont home eye drops (pt wants to use home supplies)     DVT prophylaxis: SCD/Compression stockings Code Status: Full code  Family Communication: spoke with son [name] Marlowe Level of care: Telemetry Medical Dispo:   The patient is from: home Anticipated d/c is to: home in  1 to 2 days       TOTAL TIME TAKING CARE OF THIS PATIENT: 35 minutes.  >50% time spent on counselling and coordination of care  Note: This dictation was prepared with Dragon dictation along with smaller phrase technology. Any transcriptional errors that result from this process are unintentional.  Fritzi Mandes M.D    [facility]   CC: Primary care physician; Rusty Aus, MD

## 2023-01-13 NOTE — Progress Notes (Signed)
GI Inpatient Follow-up Note  Subjective:  Patient seen and doing well. No overt GI bleeding.  Scheduled Inpatient Medications:   bimatoprost  1 drop Both Eyes QHS   cyanocobalamin  1,000 mcg Oral Daily   dorzolamide  1 drop Both Eyes BID   fluticasone furoate-vilanterol  1 puff Inhalation Daily   And   umeclidinium bromide  1 puff Inhalation Daily   insulin aspart  0-20 Units Subcutaneous TID WC   insulin aspart  0-5 Units Subcutaneous QHS   pantoprazole  40 mg Oral BID   Polyethyl Glycol-Propyl Glycol  1 drop Ophthalmic BID   sucralfate  1 g Oral QID    Continuous Inpatient Infusions:    PRN Inpatient Medications:  ondansetron **OR** ondansetron (ZOFRAN) IV  Review of Systems:  Review of Systems  Constitutional:  Negative for chills and fever.  Respiratory:  Negative for shortness of breath.   Cardiovascular:  Negative for chest pain.  Gastrointestinal:  Negative for abdominal pain, blood in stool, constipation, diarrhea, melena, nausea and vomiting.  Musculoskeletal:  Negative for joint pain.  Skin:  Negative for rash.  Neurological:  Negative for sensory change.  Psychiatric/Behavioral:  Negative for substance abuse.   All other systems reviewed and are negative.     Physical Examination: BP (!) 140/64 (BP Location: Right Arm)   Pulse 76   Temp 98.2 F (36.8 C)   Resp 18   Ht '5\' 10"'$  (1.778 m)   Wt 86.2 kg   SpO2 100%   BMI 27.26 kg/m  Gen: NAD, alert and oriented x 4 HEENT: PEERLA, EOMI, Neck: supple, no JVD or thyromegaly Chest: No respiratory distress Abd: soft, non-tender, non-distended Ext: no edema, well perfused with 2+ pulses, Skin: no rash or lesions noted Lymph: no LAD  Data: Lab Results  Component Value Date   WBC 2.2 (L) 01/13/2023   HGB 7.3 (L) 01/13/2023   HCT 21.0 (L) 01/13/2023   MCV 90.5 01/13/2023   PLT 126 (L) 01/13/2023   Recent Labs  Lab 01/11/23 1858 01/12/23 0453 01/13/23 0413  HGB 6.2* 7.4* 7.3*   Lab Results   Component Value Date   NA 134 (L) 01/13/2023   K 3.3 (L) 01/13/2023   CL 106 01/13/2023   CO2 22 01/13/2023   BUN 11 01/13/2023   CREATININE 0.71 01/13/2023   Lab Results  Component Value Date   ALT 11 01/13/2023   AST 15 01/13/2023   ALKPHOS 60 01/13/2023   BILITOT 2.0 (H) 01/13/2023   Recent Labs  Lab 01/13/23 0413  INR 1.3*   Assessment/Plan: Mr. Ahn is a 89 y.o. gentleman with history of COPD on 2l, HFpEF, and hypertension who presented with low hemoglobin and negative EGD/Colon about 1-2 weeks ago. Initially looks like was non iron deficient (although hard to tell when the initial iron studies were drawn compared to his blood transfusion)   - recommend monitoring for any overt GI bleeding - transfuse to keep hemoglobin > 7 - plan for VCE as an outpatient - given positive hemolysis labs and pancytopenia, consider hematology evaluation - can switch to PO PPI   Will continue to follow, please call with any questions or concerns.  Raylene Miyamoto MD, MPH [place]

## 2023-01-14 ENCOUNTER — Other Ambulatory Visit: Payer: Self-pay | Admitting: *Deleted

## 2023-01-14 ENCOUNTER — Inpatient Hospital Stay: Payer: Medicare Other

## 2023-01-14 DIAGNOSIS — D61818 Other pancytopenia: Secondary | ICD-10-CM | POA: Diagnosis not present

## 2023-01-14 DIAGNOSIS — D649 Anemia, unspecified: Secondary | ICD-10-CM

## 2023-01-14 DIAGNOSIS — E782 Mixed hyperlipidemia: Secondary | ICD-10-CM | POA: Diagnosis not present

## 2023-01-14 DIAGNOSIS — K922 Gastrointestinal hemorrhage, unspecified: Secondary | ICD-10-CM | POA: Diagnosis not present

## 2023-01-14 DIAGNOSIS — K921 Melena: Secondary | ICD-10-CM | POA: Diagnosis not present

## 2023-01-14 DIAGNOSIS — E785 Hyperlipidemia, unspecified: Secondary | ICD-10-CM | POA: Diagnosis not present

## 2023-01-14 LAB — CBC WITH DIFFERENTIAL/PLATELET
Abs Immature Granulocytes: 0.07 10*3/uL (ref 0.00–0.07)
Basophils Absolute: 0 10*3/uL (ref 0.0–0.1)
Basophils Relative: 0 %
Eosinophils Absolute: 0 10*3/uL (ref 0.0–0.5)
Eosinophils Relative: 1 %
HCT: 22.6 % — ABNORMAL LOW (ref 39.0–52.0)
Hemoglobin: 7.8 g/dL — ABNORMAL LOW (ref 13.0–17.0)
Immature Granulocytes: 3 %
Lymphocytes Relative: 34 %
Lymphs Abs: 0.9 10*3/uL (ref 0.7–4.0)
MCH: 31.3 pg (ref 26.0–34.0)
MCHC: 34.5 g/dL (ref 30.0–36.0)
MCV: 90.8 fL (ref 80.0–100.0)
Monocytes Absolute: 0.8 10*3/uL (ref 0.1–1.0)
Monocytes Relative: 31 %
Neutro Abs: 0.8 10*3/uL — ABNORMAL LOW (ref 1.7–7.7)
Neutrophils Relative %: 31 %
Platelets: 125 10*3/uL — ABNORMAL LOW (ref 150–400)
RBC: 2.49 MIL/uL — ABNORMAL LOW (ref 4.22–5.81)
RDW: 15.7 % — ABNORMAL HIGH (ref 11.5–15.5)
WBC: 2.5 10*3/uL — ABNORMAL LOW (ref 4.0–10.5)
nRBC: 1.2 % — ABNORMAL HIGH (ref 0.0–0.2)

## 2023-01-14 LAB — HAPTOGLOBIN: Haptoglobin: 89 mg/dL (ref 38–329)

## 2023-01-14 LAB — GLUCOSE, CAPILLARY
Glucose-Capillary: 132 mg/dL — ABNORMAL HIGH (ref 70–99)
Glucose-Capillary: 115 mg/dL — ABNORMAL HIGH (ref 70–99)

## 2023-01-14 LAB — PATHOLOGIST SMEAR REVIEW

## 2023-01-14 MED ORDER — LIDOCAINE HCL (PF) 1 % IJ SOLN
10.0000 mL | Freq: Once | INTRAMUSCULAR | Status: AC
  Administered 2023-01-14: 10 mL

## 2023-01-14 MED ORDER — FENTANYL CITRATE (PF) 100 MCG/2ML IJ SOLN
INTRAMUSCULAR | Status: AC
  Filled 2023-01-14: qty 2

## 2023-01-14 MED ORDER — MIDAZOLAM HCL 2 MG/2ML IJ SOLN
INTRAMUSCULAR | Status: AC
  Filled 2023-01-14: qty 4

## 2023-01-14 MED ORDER — FENTANYL CITRATE (PF) 100 MCG/2ML IJ SOLN
INTRAMUSCULAR | Status: AC | PRN
  Administered 2023-01-14: 50 ug via INTRAVENOUS

## 2023-01-14 MED ORDER — HEPARIN SOD (PORK) LOCK FLUSH 100 UNIT/ML IV SOLN
INTRAVENOUS | Status: AC
  Filled 2023-01-14: qty 5

## 2023-01-14 MED ORDER — MIDAZOLAM HCL 2 MG/2ML IJ SOLN
INTRAMUSCULAR | Status: AC | PRN
  Administered 2023-01-14: .5 mg via INTRAVENOUS

## 2023-01-14 NOTE — Progress Notes (Signed)
Mobility Specialist - Progress Note   01/14/23 1226  Mobility  Activity Ambulated with assistance in hallway  Level of Assistance Standby assist, set-up cues, supervision of patient - no hands on  Assistive Device Front wheel walker  Distance Ambulated (ft) 200 ft  Activity Response Tolerated well  $Mobility charge 1 Mobility     Pre-mobility: 90 HR, 97% SpO2 Post-mobility: 107 HR, 95% SpO2   Pt lying in bed upon arrival, utilizing RA. Pt agreeable to activity. Pt completed bed mobility modI; STS to RW and ambulation with minG. Pt reports SOB with activity; difficult to obtain sats during ambulation d/t cold phalanges. Max HR 114 bpm. Denied dizziness; no other complaints. Pt returned to bed with alarm set, needs in reach. RN notified.    Kathee Delton Mobility Specialist 01/14/23, 12:30 PM

## 2023-01-14 NOTE — Care Management Important Message (Signed)
Important Message  Patient Details  Name: Ozro Klepacki MRN: [id number] Date of Birth: 08-May-1933   Medicare Important Message Given:  Yes     Dannette Barbara 01/14/2023, 12:36 PM

## 2023-01-14 NOTE — Consult Note (Signed)
Chief Complaint: Patient was seen in consultation today for pancytopenia  Referring Physician(s): Randa Evens, MD  Supervising Physician: Juliet Rude  Patient Status: Derek Haney - In-pt  History of Present Illness: Derek Haney is a 89 y.o. male with PMH significant for anemia, CHF, hypertension, and hx of myocardial infarction being seen today for image-guided bone marrow biopsy. The patient has been experiencing chronic anemia without any source of bleeding identified. Patient was subsequently referred to IR for bone marrow biopsy to further evaluate his anemia.   Past Medical History:  Diagnosis Date   Anemia    Cancer ([place])    basal cell   CHF (congestive heart failure) ([facility])    Coronary artery disease    Dysrhythmia    GERD (gastroesophageal reflux disease)    Glaucoma (increased eye pressure)    Hypertension    Moderate COPD (chronic obstructive pulmonary disease) ([place]) 06/19/2018   Myocardial infarction [facility])    Wears dentures    partial upper    Past Surgical History:  Procedure Laterality Date   CATARACT EXTRACTION W/PHACO Left 01/17/2022   Procedure: CATARACT EXTRACTION PHACO AND INTRAOCULAR LENS PLACEMENT ([place]) LEFT MALYUGIN VISION BLUE KAHOOK DUAL BLADE GONIOTOMY;  Surgeon: Leandrew Koyanagi, MD;  Location: [place];  Service: Ophthalmology;  Laterality: Left;  13.73 01:56.5   COLONOSCOPY WITH PROPOFOL N/A 12/30/2022   Procedure: COLONOSCOPY WITH PROPOFOL;  Surgeon: Lin Landsman, MD;  Location[facility] END[facility]COPY;  Service: Gastroenterology;  Laterality: N/A;   CORONARY ANGIOPLASTY     ESOPHAGOGASTRODUODENOSCOPY (EGD) WITH PROPOFOL N/A 12/30/2022   Procedure: ESOPHAGOGASTRODUODENOSCOPY (EGD) WITH PROPOFOL;  Surgeon: Lin Landsman, MD;  Location: [facility] ENDOSCOPY;  Service: Gastroenterology;  Laterality: N/A;   TONSILLECTOMY     TOTAL HIP ARTHROPLASTY Right 03/21/2016   Procedure: TOTAL HIP ARTHROPLASTY;  Surgeon: [name], MD;   Location: [facility] ORS;  Service: Orthopedics;  Laterality: Right;    Allergies: Aspirin and Penicillins  Medications: Prior to Admission medications   Medication Sig Start Date End Date Taking? Authorizing Provider  albuterol (PROVENTIL HFA;VENTOLIN HFA) 108 (90 Base) MCG/ACT inhaler Inhale 1-2 puffs into the lungs every 6 (six) hours as needed for wheezing or shortness of breath. 06/03/18  Yes Wilhelmina Mcardle, MD  ASPIRIN 81 PO Take 1 tablet by mouth daily.   Yes [provider]  atorvastatin (LIPITOR) 20 MG tablet Take 20 mg by mouth daily at 6 PM.   Yes [provider]  bimatoprost (LUMIGAN) 0.01 % SOLN Place 1 drop into both eyes at bedtime.   Yes [provider]  cyanocobalamin 1000 MCG tablet Take 1 tablet (1,000 mcg total) by mouth daily. 12/30/22 03/30/23 Yes Dahal, Marlowe Aschoff, MD  dorzolamide (TRUSOPT) 2 % ophthalmic solution Place 1 drop into both eyes 2 (two) times daily.   Yes [provider]  famotidine (PEPCID) 40 MG tablet Take 1 tablet by mouth at bedtime. 01/11/23 01/11/24 Yes [provider]  ferrous sulfate 325 (65 FE) MG tablet Take 325 mg by mouth daily with breakfast.   Yes [provider]  isosorbide mononitrate (IMDUR) 30 MG 24 hr tablet Take 30 mg by mouth 3 (three) times daily. 12/18/22  Yes [provider]  oxymetazoline (NASAL RELIEF) 0.05 % nasal spray Place 1 spray into both nostrils 2 (two) times daily as needed.   Yes [provider]  pantoprazole (PROTONIX) 40 MG tablet Take 1 tablet (40 mg total) by mouth daily. 12/30/22 03/30/23 Yes Dahal, Marlowe Aschoff, MD  Polyethyl  Glycol-Propyl Glycol (SYSTANE FREE OP) Apply 2 drops to eye in the morning and at bedtime.   Yes [provider]  potassium chloride SA (K-DUR,KLOR-CON) 20 MEQ tablet Take 20 mEq by mouth daily.   Yes [provider]  propranolol (INDERAL) 20 MG tablet Take 20 mg by mouth 4 (four) times daily.   Yes [provider]   sucralfate (CARAFATE) 1 g tablet Take 1 g by mouth 4 (four) times daily.   Yes [provider]  traZODone (DESYREL) 50 MG tablet Take 100 mg by mouth at bedtime.   Yes [provider]  TRELEGY ELLIPTA 100-62.5-25 MCG/INH AEPB INHALE 1 PUFFS BY MOUTH ONCE DAILY 04/08/19  Yes Wilhelmina Mcardle, MD  cycloSPORINE (RESTASIS) 0.05 % ophthalmic emulsion Place 1 drop into both eyes 2 (two) times daily.    [provider]  FERGON 240 (27 Fe) MG tablet Take 1 tablet by mouth every morning. Patient not taking: Reported on 01/12/2023 10/30/22   [provider]     Family History  Problem Relation Age of Onset   Diabetes Child     Social History   Socioeconomic History   Marital status: Married    Spouse name: Not on file   Number of children: Not on file   Years of education: Not on file   Highest education level: Not on file  Occupational History   Not on file  Tobacco Use   Smoking status: Former    Packs/day: 1.00    Years: 15.00    Total pack years: 15.00    Types: Cigarettes    Quit date: 2000    Years since quitting: 24.1   Smokeless tobacco: Former    Types: Chew    Quit date: 2000   Tobacco comments:    it's been over 20 since he quit  Vaping Use   Vaping Use: Never used  Substance and Sexual Activity   Alcohol use: Not Currently    Comment: occ   Drug use: No   Sexual activity: Not on file  Other Topics Concern   Not on file  Social History Narrative   Not on file   Social Determinants of Health   Financial Resource Strain: Not on file  Food Insecurity: No Food Insecurity (01/11/2023)   Hunger Vital Sign    Worried About Running Out of Food in the Last Year: Never true    Ran Out of Food in the Last Year: Never true  Transportation Needs: No Transportation Needs (01/11/2023)   PRAPARE - Hydrologist (Medical): No    Lack of Transportation (Non-Medical): No  Physical Activity: Not on file  Stress: Not  on file  Social Connections: Not on file    Code Status: Full code  Review of Systems: A 12 point ROS discussed and pertinent positives are indicated in the HPI above.  All other systems are negative.  Review of Systems  Constitutional:  Negative for chills and fever.  Respiratory:  Positive for shortness of breath. Negative for chest tightness.   Cardiovascular:  Negative for chest pain and leg swelling.  Gastrointestinal:  Negative for abdominal pain, diarrhea, nausea and vomiting.  Neurological:  Positive for dizziness. Negative for headaches.  Psychiatric/Behavioral:  Negative for confusion.     Vital Signs: BP 136/74 (BP Location: Right Arm)   Pulse 87   Temp 98.4 F (36.9 C) (Oral)   Resp 19   Ht '5\' 10"'$  (1.778 m)  Wt 189 lb 15.9 oz (86.2 kg)   SpO2 95%   BMI 27.26 kg/m    Physical Exam Vitals reviewed.  Constitutional:      General: He is not in acute distress.    Appearance: He is ill-appearing.  HENT:     Mouth/Throat:     Mouth: Mucous membranes are moist.  Cardiovascular:     Rate and Rhythm: Normal rate and regular rhythm.     Pulses: Normal pulses.     Heart sounds: Normal heart sounds.  Pulmonary:     Effort: Pulmonary effort is normal.     Breath sounds: Normal breath sounds.  Abdominal:     Palpations: Abdomen is soft.     Tenderness: There is no abdominal tenderness.  Musculoskeletal:     Right lower leg: No edema.     Left lower leg: No edema.  Skin:    General: Skin is warm and dry.  Neurological:     Mental Status: He is alert and oriented to person, place, and time.  Psychiatric:        Mood and Affect: Mood normal.        Behavior: Behavior normal.        Thought Content: Thought content normal.        Judgment: Judgment normal.     Imaging: DG Chest Portable 1 View  Result Date: 12/29/2022 CLINICAL DATA:  chest pain/SOB EXAM: PORTABLE CHEST 1 VIEW COMPARISON:  Chest x-ray 05/11/2021, CT chest 11/21/2022 FINDINGS: The heart and  mediastinal contours are unchanged. Aortic calcification. No focal consolidation. Chronic coarsened interstitial markings with no overt pulmonary edema. No pleural effusion. No pneumothorax. No acute osseous abnormality. IMPRESSION: 1. No active disease. 2. Aortic Atherosclerosis (ICD10-I70.0) and Emphysema (ICD10-J43.9). Electronically Signed   By: Iven Finn M.D.   On: 12/29/2022 02:28    Labs:  CBC: Recent Labs    01/11/23 1858 01/12/23 0453 01/13/23 0413 01/14/23 0340  WBC 2.6* 2.9* 2.2* 2.5*  HGB 6.2* 7.4* 7.3* 7.8*  HCT 18.2* 21.7* 21.0* 22.6*  PLT 145* 133* 126* 125*    COAGS: Recent Labs    12/29/22 0159 01/13/23 0413  INR 1.2 1.3*  APTT 27  --     BMP: Recent Labs    01/11/23 1858 01/12/23 0453 01/13/23 0413 01/13/23 0821  NA 134* 137 134* 134*  K 3.7 3.3* 3.1* 3.3*  CL 102 107 107 106  CO2 20* 22 21* 22  GLUCOSE 204* 105* 137* 115*  BUN '22 20 12 11  '$ CALCIUM 9.0 8.5* 8.6* 8.6*  CREATININE 0.98 0.77 0.72 0.71  GFRNONAA >60 >60 >60 >60    LIVER FUNCTION TESTS: Recent Labs    12/29/22 0159 01/11/23 1858 01/12/23 0453 01/13/23 0413 01/13/23 0821  BILITOT 1.0 1.3* 2.9* 1.8* 2.0*  AST '17 18 17  '$ --  15  ALT '16 12 11  '$ --  11  ALKPHOS 77 67 58  --  60  PROT 7.0 6.7 5.8*  --  6.3*  ALBUMIN 3.6 3.3* 3.0*  --  3.3*    TUMOR MARKERS: No results for input(s): "AFPTM", "CEA", "CA199", "CHROMGRNA" in the last 8760 hours.  Assessment and Plan:  Herod Connerton is an 89 yo male being seen today in relation to chronic anemia. The patient was admitted to [facility] on 2/23 for anemia without an identified bleed. Patient was subsequently referred to IR for image-guided bone marrow biopsy to further evaluate his chronic anemia. Case has been reviewed and approved  by Dr Denna Haggard. Case is tentatively scheduled for 01/14/23.   Risks and benefits of image-guided bone marrow biopsy was discussed with the patient and/or patient's family including, but not limited to  bleeding, infection, damage to adjacent structures or low yield requiring additional tests.  All of the questions were answered and there is agreement to proceed.  Consent signed and in chart.   Thank you for this interesting consult.  I greatly enjoyed meeting Kastyn Steltenpohl and look forward to participating in their care.  A copy of this report was sent to the requesting provider on this date.  Electronically Signed: Lura Em, PA-C 01/14/2023, 9:00 AM   I spent a total of 40 Minutes  in face to face in clinical consultation, greater than 50% of which was counseling/coordinating care for chronic anemia.

## 2023-01-14 NOTE — Procedures (Signed)
Interventional Radiology Procedure Note  Date of Procedure: 01/14/2023  Procedure: BMBx   Findings:  1. CT BMBx    Complications: No immediate complications noted.   Estimated Blood Loss: minimal  Follow-up and Recommendations: 1. Bedrest 1 hour    Albin Felling, MD  Vascular & Interventional Radiology  01/14/2023 10:15 AM

## 2023-01-14 NOTE — TOC CM/SW Note (Signed)
  Transition of Care [facility]) Screening Note   Patient Details  Name: Derek Haney Date of Birth: 02/21/1933   Transition of [facility]) CM/SW Contact:    Candie Chroman, LCSW Phone Number: 01/14/2023, 9:30 AM    Transition of Care Department [facility]) has reviewed patient and no TOC needs have been identified at this time. We will continue to monitor patient advancement through interdisciplinary progression rounds. If new patient transition needs arise, please place a TOC consult.

## 2023-01-14 NOTE — Consult Note (Signed)
Hematology/Oncology Consult note [facility] Telephone:([phone] Fax:([phone]) [phone]  Patient Care Team: Rusty Aus, MD as PCP - General (Internal Medicine)   Name of the patient: Derek Haney  [id number]  1932/12/27    Reason for consult: pancytopenia   Requesting physician: Dr. Posey Pronto  Date of visit: 01/14/2023    History of presenting illness- Patient is a 89 year old male with a past medical history significant for COPD, pretension hyperlipidemia type 2 diabetes, GERD who was admitted to the hospital 2 weeks ago for possible GI bleeding and underwent EGD and colonoscopy.  His ferritin in 2020 was 25 but on 01/11/2023 it was normal at 211.  His hemoglobin which was 12.6 in September 2023 had drifted down to 8.4 in January 2024 with an MCV of 102.9.  White count was slightly lower at 3.5 at that time with a platelet count of 120.EGD showed submucosal tear in the lower third of the esophagus for which she underwent a hemostatic clip placement.  Colonoscopy showed 4-5 polyps which were resected.  These were negative for malignancy.  Severe diverticulosis with no evidence of diverticular bleeding.  Presently his hemoglobin over the last 1 month has been around 7 with a white count that has remained around 2.5.  Differential has mainly showed neutropenia with an ANC of 0.8 with increase in nucleated RBCs.  ECOG PS- 2  Pain scale- 0   Review of systems- Review of Systems  Constitutional:  Positive for malaise/fatigue. Negative for chills, fever and weight loss.  HENT:  Negative for congestion, ear discharge and nosebleeds.   Eyes:  Negative for blurred vision.  Respiratory:  Negative for cough, hemoptysis, sputum production, shortness of breath and wheezing.   Cardiovascular:  Negative for chest pain, palpitations, orthopnea and claudication.  Gastrointestinal:  Negative for abdominal pain, blood in stool, constipation, diarrhea, heartburn, melena, nausea  and vomiting.  Genitourinary:  Negative for dysuria, flank pain, frequency, hematuria and urgency.  Musculoskeletal:  Negative for back pain, joint pain and myalgias.  Skin:  Negative for rash.  Neurological:  Negative for dizziness, tingling, focal weakness, seizures, weakness and headaches.  Endo/Heme/Allergies:  Does not bruise/bleed easily.  Psychiatric/Behavioral:  Negative for depression and suicidal ideas. The patient does not have insomnia.     Allergies  Allergen Reactions   Aspirin Other (See Comments)    Occult blood in stool; higher doses- Pt can take small dose coated tablet   Penicillins Rash    Patient Active Problem List   Diagnosis Date Noted   GI bleed 01/11/2023   Polyp of colon 12/30/2022   Lower esophageal tear with hemorrhage 12/30/2022   Symptomatic anemia 12/29/2022   Pancytopenia ([place]) 12/29/2022   HLD (hyperlipidemia) 12/29/2022   GERD (gastroesophageal reflux disease) 12/29/2022   Acute respiratory failure with hypoxia (HCC)    Impaired fasting glucose    Hypocalcemia 05/11/2021   Hypokalemia 05/11/2021   Chronic respiratory failure with hypoxia ([place]) 05/11/2021   COPD exacerbation (HCC)    Weakness    Chronic diastolic CHF (congestive heart failure) (HCC)    Elevated glucose    MI (myocardial infarction) ([place]) 11/04/2018   Right bundle branch block 11/04/2018   CAD (coronary artery disease) 06/19/2018   Moderate COPD (chronic obstructive pulmonary disease) ([place]) 06/19/2018   Pulmonary hypertension, moderate to severe ([place]) 06/19/2018   Diabetes mellitus, type II ([place]) 06/19/2018   Bradycardia 01/16/2018   Gastroesophageal reflux disease without esophagitis 07/16/2016   Mixed hyperlipidemia 07/16/2016   S/P  total hip arthroplasty 03/21/2016   Benign essential hypertension 04/15/2015   Glaucoma 06/10/2012     Past Medical History:  Diagnosis Date   Anemia    Cancer ([place])    basal cell   CHF (congestive heart failure) (HCC)    Coronary  artery disease    Dysrhythmia    GERD (gastroesophageal reflux disease)    Glaucoma (increased eye pressure)    Hypertension    Moderate COPD (chronic obstructive pulmonary disease) ([place]) 06/19/2018   Myocardial infarction [facility])    Wears dentures    partial upper     Past Surgical History:  Procedure Laterality Date   CATARACT EXTRACTION W/PHACO Left 01/17/2022   Procedure: CATARACT EXTRACTION PHACO AND INTRAOCULAR LENS PLACEMENT ([place]) LEFT MALYUGIN VISION BLUE KAHOOK DUAL BLADE GONIOTOMY;  Surgeon: Leandrew Koyanagi, MD;  Location: [place];  Service: Ophthalmology;  Laterality: Left;  13.73 01:56.5   COLONOSCOPY WITH PROPOFOL N/A 12/30/2022   Procedure: COLONOSCOPY WITH PROPOFOL;  Surgeon: Lin Landsman, MD;  Location: [facility] ENDOSCOPY;  Service: Gastroenterology;  Laterality: N/A;   CORONARY ANGIOPLASTY     ESOPHAGOGASTRODUODENOSCOPY (EGD) WITH PROPOFOL N/A 12/30/2022   Procedure: ESOPHAGOGASTRODUODENOSCOPY (EGD) WITH PROPOFOL;  Surgeon: Lin Landsman, MD;  Location: [facility] ENDOSCOPY;  Service: Gastroenterology;  Laterality: N/A;   TONSILLECTOMY     TOTAL HIP ARTHROPLASTY Right 03/21/2016   Procedure: TOTAL HIP ARTHROPLASTY;  Surgeon: [name], MD;  Location: [facility] ORS;  Service: Orthopedics;  Laterality: Right;    Social History   Socioeconomic History   Marital status: Married    Spouse name: Not on file   Number of children: Not on file   Years of education: Not on file   Highest education level: Not on file  Occupational History   Not on file  Tobacco Use   Smoking status: Former    Packs/day: 1.00    Years: 15.00    Total pack years: 15.00    Types: Cigarettes    Quit date: 2000    Years since quitting: 24.1   Smokeless tobacco: Former    Types: Chew    Quit date: 2000   Tobacco comments:    it's been over 20 since he quit  Vaping Use   Vaping Use: Never used  Substance and Sexual Activity   Alcohol use: Not Currently    Comment: occ    Drug use: No   Sexual activity: Not on file  Other Topics Concern   Not on file  Social History Narrative   Not on file   Social Determinants of Health   Financial Resource Strain: Not on file  Food Insecurity: No Food Insecurity (01/11/2023)   Hunger Vital Sign    Worried About Running Out of Food in the Last Year: Never true    Ran Out of Food in the Last Year: Never true  Transportation Needs: No Transportation Needs (01/11/2023)   PRAPARE - Hydrologist (Medical): No    Lack of Transportation (Non-Medical): No  Physical Activity: Not on file  Stress: Not on file  Social Connections: Not on file  Intimate Partner Violence: Not At Risk (01/11/2023)   Humiliation, Afraid, Rape, and Kick questionnaire    Fear of Current or Ex-Partner: No    Emotionally Abused: No    Physically Abused: No    Sexually Abused: No     Family History  Problem Relation Age of Onset   Diabetes Child  Current Facility-Administered Medications:    atorvastatin (LIPITOR) tablet 20 mg, 20 mg, Oral, q1800, Fritzi Mandes, MD, 20 mg at 01/13/23 1658   bimatoprost (LUMIGAN) 0.01 % ophthalmic solution 1 drop, 1 drop, Both Eyes, QHS, Enzo Bi, MD, 1 drop at 01/13/23 2227   cyanocobalamin (VITAMIN B12) tablet 1,000 mcg, 1,000 mcg, Oral, Daily, Enzo Bi, MD, 1,000 mcg at 01/13/23 0859   dorzolamide (TRUSOPT) 2 % ophthalmic solution 1 drop, 1 drop, Both Eyes, BID, Enzo Bi, MD, 1 drop at 01/14/23 0500   fentaNYL (SUBLIMAZE) 100 MCG/2ML injection, , , ,    fluticasone furoate-vilanterol (BREO ELLIPTA) 100-25 MCG/ACT 1 puff, 1 puff, Inhalation, Daily, 1 puff at 01/14/23 0834 **AND** umeclidinium bromide (INCRUSE ELLIPTA) 62.5 MCG/ACT 1 puff, 1 puff, Inhalation, Daily, Gala Romney L, MD, 1 puff at 01/14/23 0834   heparin lock flush 100 UNIT/ML injection, , , ,    insulin aspart (novoLOG) injection 0-20 Units, 0-20 Units, Subcutaneous, TID WC, Elwyn Reach, MD, 3 Units at  01/14/23 [id number]   insulin aspart (novoLOG) injection 0-5 Units, 0-5 Units, Subcutaneous, QHS, Garba, Mohammad L, MD   midazolam (VERSED) 2 MG/2ML injection, , , ,    ondansetron (ZOFRAN) tablet 4 mg, 4 mg, Oral, Q6H PRN **OR** ondansetron (ZOFRAN) injection 4 mg, 4 mg, Intravenous, Q6H PRN, Jonelle Sidle, Mohammad L, MD   pantoprazole (PROTONIX) EC tablet 40 mg, 40 mg, Oral, BID, Enzo Bi, MD, 40 mg at 01/13/23 2226   Polyethyl Glycol-Propyl Glycol 0.4-0.3 % SOLN 1 drop, 1 drop, Ophthalmic, BID, Enzo Bi, MD, 1 drop at 01/14/23 0503   sucralfate (CARAFATE) tablet 1 g, 1 g, Oral, QID, Enzo Bi, MD, 1 g at 01/13/23 2226   Physical exam:  Vitals:   01/14/23 1040 01/14/23 1109 01/14/23 1140 01/14/23 1240  BP: (!) 129/58 (!) 141/67 (!) 128/55 137/61  Pulse: 84 85 85 87  Resp:  '16 16 17  '$ Temp:  98.5 F (36.9 C) 98.6 F (37 C)   TempSrc:  Oral Oral   SpO2: 93% 100% 97% 97%  Weight:      Height:       Physical Exam Cardiovascular:     Rate and Rhythm: Normal rate and regular rhythm.     Heart sounds: Normal heart sounds.  Pulmonary:     Effort: Pulmonary effort is normal.     Breath sounds: Normal breath sounds.  Abdominal:     General: Bowel sounds are normal.     Palpations: Abdomen is soft.     Comments: No palpable hepatosplenomegaly  Lymphadenopathy:     Comments: No palpable cervical, supraclavicular, axillary or inguinal adenopathy    Skin:    General: Skin is warm and dry.  Neurological:     Mental Status: He is alert and oriented to person, place, and time.           Latest Ref Rng & Units 01/13/2023    8:21 AM  CMP  Glucose 70 - 99 mg/dL 115   BUN 8 - 23 mg/dL 11   Creatinine 0.61 - 1.24 mg/dL 0.71   Sodium 135 - 145 mmol/L 134   Potassium 3.5 - 5.1 mmol/L 3.3   Chloride 98 - 111 mmol/L 106   CO2 22 - 32 mmol/L 22   Calcium 8.9 - 10.3 mg/dL 8.6   Total Protein 6.5 - 8.1 g/dL 6.3   Total Bilirubin 0.3 - 1.2 mg/dL 2.0   Alkaline Phos 38 - 126 U/L 60   AST 15 -  41  U/L 15   ALT 0 - 44 U/L 11       Latest Ref Rng & Units 01/14/2023    3:40 AM  CBC  WBC 4.0 - 10.5 K/uL 2.5   Hemoglobin 13.0 - 17.0 g/dL 7.8   Hematocrit 39.0 - 52.0 % 22.6   Platelets 150 - 400 K/uL 125     '@IMAGES'$ @  DG Chest Portable 1 View  Result Date: 12/29/2022 CLINICAL DATA:  chest pain/SOB EXAM: PORTABLE CHEST 1 VIEW COMPARISON:  Chest x-ray 05/11/2021, CT chest 11/21/2022 FINDINGS: The heart and mediastinal contours are unchanged. Aortic calcification. No focal consolidation. Chronic coarsened interstitial markings with no overt pulmonary edema. No pleural effusion. No pneumothorax. No acute osseous abnormality. IMPRESSION: 1. No active disease. 2. Aortic Atherosclerosis (ICD10-I70.0) and Emphysema (ICD10-J43.9). Electronically Signed   By: Iven Finn M.D.   On: 12/29/2022 02:28    Assessment and plan- Patient is a 89 y.o. male admitted for possible GI bleed 2 weeks ago.  Hematology presently consulted for pancytopenia  Patient has not had any iron studies checked in January 2024 which would be suggestive of iron deficiency.  Because his hemoglobin dropped from 12-6.4 and hematocrit of 3 months there was a concern for possible GI bleed for which she was admitted about 2 weeks ago and underwent EGD and colonoscopy which showed submucosal tear in the lower third of the esophagus but no other evidence of overt bleeding.  He received PRBC transfusion and subsequently his iron studies were checked following that which shows a normal ferritin level.  It is therefore unclear if patient truly had any iron deficiency anemia.    Given that he has sustained leukopenia/neutropenia along with mild thrombocytopenia and macrocytosisWe need to rule out a primary bone marrow disorder as a cause of pancytopenia I have ordered a myeloma panel, serum free light chains, flow cytometry, peripheral blood NGS panel for myelo disorder.  Labs are not suggestive of hemolysis especially given the negative  Coombs test and reticulocyte count which is not significantly elevated for the degree of anemia.  I have discussed all this with the patient and he will be undergoing bone marrow biopsy today.  I will see him sometime this week or next week as an outpatient to discuss the results of bone marrow biopsy and further management    Visit Diagnosis 1. Anemia, unspecified type   2. Gastrointestinal hemorrhage, unspecified gastrointestinal hemorrhage type     Dr. Randa Evens, MD, MPH [facility] at [facility] [id number] 01/14/2023

## 2023-01-14 NOTE — Discharge Summary (Signed)
Physician Discharge Summary   Patient: Derek Haney MRN: [id number] DOB: 05-01-1933  Admit date:     01/11/2023  Discharge date: 01/14/23  Discharge Physician: Fritzi Mandes   PCP: Rusty Aus, MD   Recommendations at discharge:   F/u Dr Janese Banks on 01/22/2023 for f/u anemia F/u PCP in 1-2 weeks  Discharge Diagnoses: Principal Problem:   GI bleed Active Problems:   Symptomatic anemia   Pancytopenia (HCC)   CAD (coronary artery disease)   Chronic diastolic CHF (congestive heart failure) (HCC)   Benign essential hypertension   Moderate COPD (chronic obstructive pulmonary disease) (HCC)   HLD (hyperlipidemia)   GERD (gastroesophageal reflux disease)   Gastroesophageal reflux disease without esophagitis   Mixed hyperlipidemia   Diabetes mellitus, type II (HCC) Derek Haney is a 89 y.o. male with medical history significant of recent GI bleed, COPD on 2 L at night, hypertension, morbid obesity, coronary artery disease, diastolic dysfunction, GERD, diabetes type 2, presenting to the ER with Hgb 6.2.   Pt was recently hospitalized for IDA without overt GI bleeding, at discharge on 12/30/22 his hemoglobin was 7.3. He denied any overt GI bleeding now.  He underwent EGD/Colonoscopy on 12/31/22 during previous hospitalization which showed possible mallory weiss tear and diverticulosis on colonoscopy.    Chronic anemia  --Hgb 6.2 on presentation, at discharge on 12/30/22 his hemoglobin was 7.3.  unclear whether having active GI bleed.  Already had recent EGD and colonoscopy with no active bleeding source.  Iron profile during last hospitalization showed no iron def (lab drawn after 1st unit of pRBC started transfusing).  Vit B12 248. --s/p 2u pRBC so far during this hospitalization. --GI consult with Dr. Haig Prophet. Recommends hematology to see patient given chronic anemia with no source of bleeding noted. Video capsule endoscopy to be done as outpatient. --oral PPI --cont home B12 supplement --  patient to be seen by Dr. Janese Banks-- hematology oncology. Patient likely will get bone marrow biopsy tomorrow by IR as per discussion with oncology --s/p BM biopsy by IR today 01/14/23. OK to d/c home per Hematology. F/u 01/22/23 --hgb 7.8   Type 2 diabetes --SSI   essential hypertension:  --BP has been wnl without BP meds on hold    Morbid obesity, ruled out Overweight, BMI 27    coronary artery disease:  -- Will resume ASA for now --resume statin     chronic diastolic heart failure: Stable.   GERD:  Continue with oral PPI    COPD:  --No acute exacerbation.  Patient uses oxygen mainly at night.     hyperlipidemia:  --resume statin after discharge   Glaucoma --cont home eye drops (pt wants to use home supplies)     DVT prophylaxis: SCD/Compression stockings Code Status: Full code  Family Communication: left Vm with son [name] Level of care: Telemetry Medical Dispo:   The patient is from: home Anticipated d/c is to: home today       Consultants: GI, HEmatology Procedures performed: BM biospy  Disposition: Home Diet recommendation:  Discharge Diet Orders (From admission, onward)     Start     Ordered   01/14/23 0000  Diet - low sodium heart healthy        01/14/23 1330           Cardiac and Carb modified diet DISCHARGE MEDICATION: Allergies as of 01/14/2023       Reactions   Aspirin Other (See Comments)   Occult blood in stool; higher doses- Pt  can take small dose coated tablet   Penicillins Rash        Medication List     STOP taking these medications    Fergon 240 (27 FE) MG tablet Generic drug: ferrous gluconate       TAKE these medications    albuterol 108 (90 Base) MCG/ACT inhaler Commonly known as: VENTOLIN HFA Inhale 1-2 puffs into the lungs every 6 (six) hours as needed for wheezing or shortness of breath.   ASPIRIN 81 PO Take 1 tablet by mouth daily.   atorvastatin 20 MG tablet Commonly known as: LIPITOR Take 20 mg by  mouth daily at 6 PM.   bimatoprost 0.01 % Soln Commonly known as: LUMIGAN Place 1 drop into both eyes at bedtime.   cyanocobalamin 1000 MCG tablet Take 1 tablet (1,000 mcg total) by mouth daily.   cycloSPORINE 0.05 % ophthalmic emulsion Commonly known as: RESTASIS Place 1 drop into both eyes 2 (two) times daily.   dorzolamide 2 % ophthalmic solution Commonly known as: TRUSOPT Place 1 drop into both eyes 2 (two) times daily.   famotidine 40 MG tablet Commonly known as: PEPCID Take 1 tablet by mouth at bedtime.   ferrous sulfate 325 (65 FE) MG tablet Take 325 mg by mouth daily with breakfast.   isosorbide mononitrate 30 MG 24 hr tablet Commonly known as: IMDUR Take 30 mg by mouth 3 (three) times daily.   Nasal Relief 0.05 % nasal spray Generic drug: oxymetazoline Place 1 spray into both nostrils 2 (two) times daily as needed.   pantoprazole 40 MG tablet Commonly known as: PROTONIX Take 1 tablet (40 mg total) by mouth daily.   potassium chloride SA 20 MEQ tablet Commonly known as: KLOR-CON M Take 20 mEq by mouth daily.   propranolol 20 MG tablet Commonly known as: INDERAL Take 20 mg by mouth 4 (four) times daily.   sucralfate 1 g tablet Commonly known as: CARAFATE Take 1 g by mouth 4 (four) times daily.   SYSTANE FREE OP Apply 2 drops to eye in the morning and at bedtime.   traZODone 50 MG tablet Commonly known as: DESYREL Take 100 mg by mouth at bedtime.   Trelegy Ellipta 100-62.5-25 MCG/ACT Aepb Generic drug: Fluticasone-Umeclidin-Vilant INHALE 1 PUFFS BY MOUTH ONCE DAILY        Follow-up Information     Rusty Aus, MD. Schedule an appointment as soon as possible for a visit in 1 week(s).   Specialty: Internal Medicine Contact information: [address] [place] [address] [phone]         Sindy Guadeloupe, MD. Schedule an appointment as soon as possible for a visit on 01/22/2023.   Specialty:  Oncology Why: for f/u Anemai and BM biopsy results Contact information: [place] [address] [phone]                 [place] [employer]   01/11/23 1853  Weight: 86.2 kg     Condition at discharge: fair  The results of significant diagnostics from this hospitalization (including imaging, microbiology, ancillary and laboratory) are listed below for reference.   Imaging Studies: DG Chest Portable 1 View  Result Date: 12/29/2022 CLINICAL DATA:  chest pain/SOB EXAM: PORTABLE CHEST 1 VIEW COMPARISON:  Chest x-ray 05/11/2021, CT chest 11/21/2022 FINDINGS: The heart and mediastinal contours are unchanged. Aortic calcification. No focal consolidation. Chronic coarsened interstitial markings with no overt pulmonary edema. No pleural effusion. No pneumothorax.  No acute osseous abnormality. IMPRESSION: 1. No active disease. 2. Aortic Atherosclerosis (ICD10-I70.0) and Emphysema (ICD10-J43.9). Electronically Signed   By: Iven Finn M.D.   On: 12/29/2022 02:28    Microbiology: Results for orders placed or performed during the hospital encounter of 05/10/21  Resp Panel by RT-PCR (Flu A&B, Covid) Nasopharyngeal Swab     Status: None   Collection Time: 05/11/21  1:55 AM   Specimen: Nasopharyngeal Swab; Nasopharyngeal(NP) swabs in vial transport medium  Result Value Ref Range Status   SARS Coronavirus 2 by RT PCR NEGATIVE NEGATIVE Final    Comment: (NOTE) SARS-CoV-2 target nucleic acids are NOT DETECTED.  The SARS-CoV-2 RNA is generally detectable in upper respiratory specimens during the acute phase of infection. The lowest concentration of SARS-CoV-2 viral copies this assay can detect is 138 copies/mL. A negative result does not preclude SARS-Cov-2 infection and should not be used as the sole basis for treatment or other patient management decisions. A negative result may occur with  improper specimen collection/handling, submission of specimen other than  nasopharyngeal swab, presence of viral mutation(s) within the areas targeted by this assay, and inadequate number of viral copies(<138 copies/mL). A negative result must be combined with clinical observations, patient history, and epidemiological information. The expected result is Negative.  Fact Sheet for Patients:  EntrepreneurPulse.com.au  Fact Sheet for Healthcare Providers:  IncredibleEmployment.be  This test is no t yet approved or cleared by the [place] FDA and  has been authorized for detection and/or diagnosis of SARS-CoV-2 by FDA under an Emergency Use Authorization (EUA). This EUA will remain  in effect (meaning this test can be used) for the duration of the COVID-19 declaration under Section 564(b)(1) of the Act, 21 U.S.C.section 360bbb-3(b)(1), unless the authorization is terminated  or revoked sooner.       Influenza A by PCR NEGATIVE NEGATIVE Final   Influenza B by PCR NEGATIVE NEGATIVE Final    Comment: (NOTE) The Xpert Xpress SARS-CoV-2/FLU/RSV plus assay is intended as an aid in the diagnosis of influenza from Nasopharyngeal swab specimens and should not be used as a sole basis for treatment. Nasal washings and aspirates are unacceptable for Xpert Xpress SARS-CoV-2/FLU/RSV testing.  Fact Sheet for Patients: EntrepreneurPulse.com.au  Fact Sheet for Healthcare Providers: IncredibleEmployment.be  This test is not yet approved or cleared by the [place] FDA and has been authorized for detection and/or diagnosis of SARS-CoV-2 by FDA under an Emergency Use Authorization (EUA). This EUA will remain in effect (meaning this test can be used) for the duration of the COVID-19 declaration under Section 564(b)(1) of the Act, 21 U.S.C. section 360bbb-3(b)(1), unless the authorization is terminated or revoked.  Performed at [facility], [address] [place], [place]  [address]   MRSA Next Gen by PCR, Nasal     Status: None   Collection Time: 05/11/21  6:30 PM   Specimen: Nasal Mucosa; Nasal Swab  Result Value Ref Range Status   MRSA by PCR Next Gen NOT DETECTED NOT DETECTED Final    Comment: (NOTE) The GeneXpert MRSA Assay (FDA approved for NASAL specimens only), is one component of a comprehensive MRSA colonization surveillance program. It is not intended to diagnose MRSA infection nor to guide or monitor treatment for MRSA infections. Test performance is not FDA approved in patients less than 86 years old. Performed at [facility], [address] [place], [place] [address]     Labs: CBC: Recent Labs  Lab 01/11/23 1858 01/12/23 418 020 0754 01/13/23  0413 01/14/23 0340  WBC 2.6* 2.9* 2.2* 2.5*  NEUTROABS  --   --   --  0.8*  HGB 6.2* 7.4* 7.3* 7.8*  HCT 18.2* 21.7* 21.0* 22.6*  MCV 96.8 91.6 90.5 90.8  PLT 145* 133* 126* 0000000*   Basic Metabolic Panel: Recent Labs  Lab 01/11/23 1858 01/12/23 0453 01/13/23 0413 01/13/23 0821  NA 134* 137 134* 134*  K 3.7 3.3* 3.1* 3.3*  CL 102 107 107 106  CO2 20* 22 21* 22  GLUCOSE 204* 105* 137* 115*  BUN '22 20 12 11  '$ CREATININE 0.98 0.77 0.72 0.71  CALCIUM 9.0 8.5* 8.6* 8.6*  MG  --   --  1.8  --    Liver Function Tests: Recent Labs  Lab 01/11/23 1858 01/12/23 0453 01/13/23 0413 01/13/23 0821  AST 18 17  --  15  ALT 12 11  --  11  ALKPHOS 67 58  --  60  BILITOT 1.3* 2.9* 1.8* 2.0*  PROT 6.7 5.8*  --  6.3*  ALBUMIN 3.3* 3.0*  --  3.3*   CBG: Recent Labs  Lab 01/13/23 1151 01/13/23 1645 01/13/23 2231 01/14/23 0801 01/14/23 1139  GLUCAP 152* 137* 139* 132* 115*    Discharge time spent: greater than 30 minutes.  Signed: Fritzi Mandes, MD [facility] 01/14/2023

## 2023-01-15 LAB — COMP PANEL: LEUKEMIA/LYMPHOMA: Immunophenotypic Profile: 27

## 2023-01-15 LAB — KAPPA/LAMBDA LIGHT CHAINS
Kappa free light chain: 38.7 mg/L — ABNORMAL HIGH (ref 3.3–19.4)
Kappa, lambda light chain ratio: 1.26 (ref 0.26–1.65)
Lambda free light chains: 30.7 mg/L — ABNORMAL HIGH (ref 5.7–26.3)

## 2023-01-16 LAB — SURGICAL PATHOLOGY

## 2023-01-21 ENCOUNTER — Ambulatory Visit: Admission: RE | Admit: 2023-01-21 | Payer: Medicare PPO | Source: Home / Self Care | Admitting: Gastroenterology

## 2023-01-21 ENCOUNTER — Encounter: Admission: RE | Payer: Self-pay | Source: Home / Self Care

## 2023-01-21 ENCOUNTER — Encounter (HOSPITAL_COMMUNITY): Payer: Self-pay | Admitting: Oncology

## 2023-01-21 LAB — MULTIPLE MYELOMA PANEL, SERUM
Albumin SerPl Elph-Mcnc: 3 g/dL (ref 2.9–4.4)
Albumin/Glob SerPl: 1.1 (ref 0.7–1.7)
Alpha 1: 0.3 g/dL (ref 0.0–0.4)
Alpha2 Glob SerPl Elph-Mcnc: 0.6 g/dL (ref 0.4–1.0)
B-Globulin SerPl Elph-Mcnc: 1 g/dL (ref 0.7–1.3)
Gamma Glob SerPl Elph-Mcnc: 1 g/dL (ref 0.4–1.8)
Globulin, Total: 2.9 g/dL (ref 2.2–3.9)
IgA: 505 mg/dL — ABNORMAL HIGH (ref 61–437)
IgG (Immunoglobin G), Serum: 905 mg/dL (ref 603–1613)
IgM (Immunoglobulin M), Srm: 114 mg/dL (ref 15–143)
Total Protein ELP: 5.9 g/dL — ABNORMAL LOW (ref 6.0–8.5)

## 2023-01-21 SURGERY — IMAGING PROCEDURE, GI TRACT, INTRALUMINAL, VIA CAPSULE

## 2023-01-22 ENCOUNTER — Encounter (HOSPITAL_COMMUNITY): Payer: Self-pay | Admitting: Oncology

## 2023-01-22 ENCOUNTER — Other Ambulatory Visit: Payer: Self-pay

## 2023-01-22 ENCOUNTER — Emergency Department: Payer: Medicare PPO

## 2023-01-22 ENCOUNTER — Other Ambulatory Visit: Payer: Medicare PPO

## 2023-01-22 ENCOUNTER — Inpatient Hospital Stay: Payer: Medicare PPO | Admitting: Oncology

## 2023-01-22 ENCOUNTER — Inpatient Hospital Stay
Admission: EM | Admit: 2023-01-22 | Discharge: 2023-01-23 | DRG: 808 | Disposition: A | Discharge status: Hospice | Payer: Medicare PPO | Attending: Internal Medicine | Admitting: Internal Medicine

## 2023-01-22 ENCOUNTER — Inpatient Hospital Stay: Payer: Medicare PPO

## 2023-01-22 ENCOUNTER — Encounter: Payer: Self-pay | Admitting: Internal Medicine

## 2023-01-22 DIAGNOSIS — Z880 Allergy status to penicillin: Secondary | ICD-10-CM | POA: Diagnosis not present

## 2023-01-22 DIAGNOSIS — E871 Hypo-osmolality and hyponatremia: Secondary | ICD-10-CM | POA: Insufficient documentation

## 2023-01-22 DIAGNOSIS — Z87891 Personal history of nicotine dependence: Secondary | ICD-10-CM | POA: Diagnosis not present

## 2023-01-22 DIAGNOSIS — Z85828 Personal history of other malignant neoplasm of skin: Secondary | ICD-10-CM | POA: Diagnosis not present

## 2023-01-22 DIAGNOSIS — Z66 Do not resuscitate: Secondary | ICD-10-CM | POA: Diagnosis present

## 2023-01-22 DIAGNOSIS — C9200 Acute myeloblastic leukemia, not having achieved remission: Secondary | ICD-10-CM | POA: Diagnosis present

## 2023-01-22 DIAGNOSIS — Z1152 Encounter for screening for COVID-19: Secondary | ICD-10-CM

## 2023-01-22 DIAGNOSIS — Z9861 Coronary angioplasty status: Secondary | ICD-10-CM

## 2023-01-22 DIAGNOSIS — J189 Pneumonia, unspecified organism: Secondary | ICD-10-CM | POA: Insufficient documentation

## 2023-01-22 DIAGNOSIS — R627 Adult failure to thrive: Secondary | ICD-10-CM | POA: Diagnosis present

## 2023-01-22 DIAGNOSIS — E876 Hypokalemia: Secondary | ICD-10-CM | POA: Diagnosis present

## 2023-01-22 DIAGNOSIS — E119 Type 2 diabetes mellitus without complications: Secondary | ICD-10-CM | POA: Diagnosis present

## 2023-01-22 DIAGNOSIS — K219 Gastro-esophageal reflux disease without esophagitis: Secondary | ICD-10-CM | POA: Diagnosis present

## 2023-01-22 DIAGNOSIS — Z96641 Presence of right artificial hip joint: Secondary | ICD-10-CM | POA: Diagnosis present

## 2023-01-22 DIAGNOSIS — I5032 Chronic diastolic (congestive) heart failure: Secondary | ICD-10-CM | POA: Diagnosis present

## 2023-01-22 DIAGNOSIS — I2510 Atherosclerotic heart disease of native coronary artery without angina pectoris: Secondary | ICD-10-CM | POA: Diagnosis present

## 2023-01-22 DIAGNOSIS — R531 Weakness: Secondary | ICD-10-CM

## 2023-01-22 DIAGNOSIS — R54 Age-related physical debility: Secondary | ICD-10-CM | POA: Diagnosis present

## 2023-01-22 DIAGNOSIS — Z7982 Long term (current) use of aspirin: Secondary | ICD-10-CM

## 2023-01-22 DIAGNOSIS — I272 Pulmonary hypertension, unspecified: Secondary | ICD-10-CM | POA: Diagnosis present

## 2023-01-22 DIAGNOSIS — Z886 Allergy status to analgesic agent status: Secondary | ICD-10-CM

## 2023-01-22 DIAGNOSIS — I252 Old myocardial infarction: Secondary | ICD-10-CM

## 2023-01-22 DIAGNOSIS — H409 Unspecified glaucoma: Secondary | ICD-10-CM | POA: Diagnosis present

## 2023-01-22 DIAGNOSIS — Z7951 Long term (current) use of inhaled steroids: Secondary | ICD-10-CM

## 2023-01-22 DIAGNOSIS — Z515 Encounter for palliative care: Secondary | ICD-10-CM

## 2023-01-22 DIAGNOSIS — D61818 Other pancytopenia: Secondary | ICD-10-CM | POA: Diagnosis present

## 2023-01-22 DIAGNOSIS — J440 Chronic obstructive pulmonary disease with acute lower respiratory infection: Secondary | ICD-10-CM | POA: Diagnosis present

## 2023-01-22 DIAGNOSIS — E782 Mixed hyperlipidemia: Secondary | ICD-10-CM | POA: Diagnosis present

## 2023-01-22 DIAGNOSIS — E785 Hyperlipidemia, unspecified: Secondary | ICD-10-CM | POA: Diagnosis present

## 2023-01-22 DIAGNOSIS — I10 Essential (primary) hypertension: Secondary | ICD-10-CM | POA: Diagnosis present

## 2023-01-22 DIAGNOSIS — I110 Hypertensive heart disease with heart failure: Secondary | ICD-10-CM | POA: Diagnosis present

## 2023-01-22 DIAGNOSIS — D649 Anemia, unspecified: Secondary | ICD-10-CM | POA: Diagnosis present

## 2023-01-22 DIAGNOSIS — Z79899 Other long term (current) drug therapy: Secondary | ICD-10-CM | POA: Diagnosis not present

## 2023-01-22 DIAGNOSIS — Z833 Family history of diabetes mellitus: Secondary | ICD-10-CM

## 2023-01-22 LAB — COMPREHENSIVE METABOLIC PANEL
ALT: 65 U/L — ABNORMAL HIGH (ref 0–44)
AST: 69 U/L — ABNORMAL HIGH (ref 15–41)
Albumin: 2.7 g/dL — ABNORMAL LOW (ref 3.5–5.0)
Alkaline Phosphatase: 92 U/L (ref 38–126)
Anion gap: 11 (ref 5–15)
BUN: 16 mg/dL (ref 8–23)
CO2: 21 mmol/L — ABNORMAL LOW (ref 22–32)
Calcium: 8.5 mg/dL — ABNORMAL LOW (ref 8.9–10.3)
Chloride: 100 mmol/L (ref 98–111)
Creatinine, Ser: 0.9 mg/dL (ref 0.61–1.24)
GFR, Estimated: >60 mL/min (ref >60)
Glucose, Bld: 156 mg/dL — ABNORMAL HIGH (ref 70–99)
Potassium: 3.6 mmol/L (ref 3.5–5.1)
Sodium: 132 mmol/L — ABNORMAL LOW (ref 135–145)
Total Bilirubin: 2.1 mg/dL — ABNORMAL HIGH (ref 0.3–1.2)
Total Protein: 7.1 g/dL (ref 6.5–8.1)

## 2023-01-22 LAB — PREPARE RBC (CROSSMATCH)

## 2023-01-22 LAB — CBC WITH DIFFERENTIAL/PLATELET
Abs Immature Granulocytes: 0.14 10*3/uL — ABNORMAL HIGH (ref 0.00–0.07)
Basophils Absolute: 0 10*3/uL (ref 0.0–0.1)
Basophils Relative: 0 %
Eosinophils Absolute: 0 10*3/uL (ref 0.0–0.5)
Eosinophils Relative: 1 %
HCT: 18.8 % — ABNORMAL LOW (ref 39.0–52.0)
Hemoglobin: 6.1 g/dL — ABNORMAL LOW (ref 13.0–17.0)
Immature Granulocytes: 3 %
Lymphocytes Relative: 20 %
Lymphs Abs: 0.8 10*3/uL (ref 0.7–4.0)
MCH: 31.6 pg (ref 26.0–34.0)
MCHC: 32.4 g/dL (ref 30.0–36.0)
MCV: 97.4 fL (ref 80.0–100.0)
Monocytes Absolute: 2.2 10*3/uL — ABNORMAL HIGH (ref 0.1–1.0)
Monocytes Relative: 53 %
Neutro Abs: 1 10*3/uL — ABNORMAL LOW (ref 1.7–7.7)
Neutrophils Relative %: 23 %
Platelets: 162 10*3/uL (ref 150–400)
RBC: 1.93 MIL/uL — ABNORMAL LOW (ref 4.22–5.81)
RDW: 15.3 % (ref 11.5–15.5)
Smear Review: NORMAL
WBC: 4.1 10*3/uL (ref 4.0–10.5)
nRBC: 0.7 % — ABNORMAL HIGH (ref 0.0–0.2)

## 2023-01-22 LAB — BRAIN NATRIURETIC PEPTIDE: B Natriuretic Peptide: 143.3 pg/mL — ABNORMAL HIGH (ref 0.0–100.0)

## 2023-01-22 LAB — PROTIME-INR
INR: 1.5 — ABNORMAL HIGH (ref 0.8–1.2)
Prothrombin Time: 18.1 seconds — ABNORMAL HIGH (ref 11.4–15.2)

## 2023-01-22 LAB — RESP PANEL BY RT-PCR (RSV, FLU A&B, COVID)  RVPGX2
Influenza A by PCR: NEGATIVE
Influenza B by PCR: NEGATIVE
Resp Syncytial Virus by PCR: NEGATIVE
SARS Coronavirus 2 by RT PCR: NEGATIVE

## 2023-01-22 LAB — APTT: aPTT: 43 seconds — ABNORMAL HIGH (ref 24–36)

## 2023-01-22 LAB — LACTIC ACID, PLASMA
Lactic Acid, Venous: 1.4 mmol/L (ref 0.5–1.9)
Lactic Acid, Venous: 1.8 mmol/L (ref 0.5–1.9)

## 2023-01-22 LAB — HEMOGLOBIN AND HEMATOCRIT, BLOOD
HCT: 20.6 % — ABNORMAL LOW (ref 39.0–52.0)
Hemoglobin: 7.1 g/dL — ABNORMAL LOW (ref 13.0–17.0)

## 2023-01-22 LAB — PROCALCITONIN: Procalcitonin: <0.1 ng/mL

## 2023-01-22 MED ORDER — ALBUTEROL SULFATE (2.5 MG/3ML) 0.083% IN NEBU: 2.5000 mg | INHALATION_SOLUTION | Freq: Four times a day (QID) | RESPIRATORY_TRACT | Status: DC | PRN

## 2023-01-22 MED ORDER — ACETAMINOPHEN 650 MG RE SUPP: 650.0000 mg | Freq: Four times a day (QID) | RECTAL | Status: DC | PRN

## 2023-01-22 MED ORDER — SUCRALFATE 1 G PO TABS
1.0000 g | ORAL_TABLET | Freq: Four times a day (QID) | ORAL | Status: DC
  Administered 2023-01-22 (×2): 1 g via ORAL
  Filled 2023-01-22 (×2): qty 1

## 2023-01-22 MED ORDER — SODIUM CHLORIDE 0.9 % IV SOLN
10.0000 mL/h | Freq: Once | INTRAVENOUS | Status: AC
  Administered 2023-01-22: 10 mL/h via INTRAVENOUS

## 2023-01-22 MED ORDER — ONDANSETRON HCL 4 MG PO TABS: 4.0000 mg | ORAL_TABLET | Freq: Four times a day (QID) | ORAL | Status: DC | PRN

## 2023-01-22 MED ORDER — HYDRALAZINE HCL 20 MG/ML IJ SOLN: 5.0000 mg | Freq: Three times a day (TID) | INTRAMUSCULAR | Status: DC | PRN

## 2023-01-22 MED ORDER — OXYMETAZOLINE HCL 0.05 % NA SOLN: 1.0000 | Freq: Two times a day (BID) | NASAL | Status: DC | PRN

## 2023-01-22 MED ORDER — ONDANSETRON HCL 4 MG/2ML IJ SOLN: 4.0000 mg | Freq: Four times a day (QID) | INTRAMUSCULAR | Status: DC | PRN

## 2023-01-22 MED ORDER — ATORVASTATIN CALCIUM 20 MG PO TABS
20.0000 mg | ORAL_TABLET | Freq: Every day | ORAL | Status: DC
  Administered 2023-01-22: 20 mg via ORAL
  Filled 2023-01-22: qty 1

## 2023-01-22 MED ORDER — SODIUM CHLORIDE 0.9 % IV SOLN
500.0000 mg | Freq: Once | INTRAVENOUS | Status: AC
  Administered 2023-01-22: 500 mg via INTRAVENOUS
  Filled 2023-01-22: qty 5

## 2023-01-22 MED ORDER — GUAIFENESIN ER 600 MG PO TB12
600.0000 mg | ORAL_TABLET | Freq: Two times a day (BID) | ORAL | Status: DC | PRN
  Administered 2023-01-22: 600 mg via ORAL
  Filled 2023-01-22: qty 1

## 2023-01-22 MED ORDER — FLUTICASONE FUROATE-VILANTEROL 100-25 MCG/ACT IN AEPB
1.0000 | INHALATION_SPRAY | Freq: Every day | RESPIRATORY_TRACT | Status: DC
  Filled 2023-01-22: qty 28

## 2023-01-22 MED ORDER — SODIUM CHLORIDE 0.9 % IV SOLN
2.0000 g | INTRAVENOUS | Status: DC
  Filled 2023-01-22: qty 20

## 2023-01-22 MED ORDER — ISOSORBIDE MONONITRATE ER 30 MG PO TB24
30.0000 mg | ORAL_TABLET | Freq: Three times a day (TID) | ORAL | Status: DC
  Administered 2023-01-22: 30 mg via ORAL
  Filled 2023-01-22: qty 1

## 2023-01-22 MED ORDER — DORZOLAMIDE HCL 2 % OP SOLN
1.0000 [drp] | Freq: Two times a day (BID) | OPHTHALMIC | Status: DC
  Administered 2023-01-22: 1 [drp] via OPHTHALMIC
  Filled 2023-01-22: qty 10

## 2023-01-22 MED ORDER — ACETAMINOPHEN 325 MG PO TABS
650.0000 mg | ORAL_TABLET | Freq: Four times a day (QID) | ORAL | Status: DC | PRN
  Administered 2023-01-22: 650 mg via ORAL
  Filled 2023-01-22: qty 2

## 2023-01-22 MED ORDER — PANTOPRAZOLE SODIUM 40 MG IV SOLR
40.0000 mg | Freq: Two times a day (BID) | INTRAVENOUS | Status: DC
  Administered 2023-01-22: 40 mg via INTRAVENOUS
  Filled 2023-01-22: qty 10

## 2023-01-22 MED ORDER — SODIUM CHLORIDE 0.9 % IV SOLN
500.0000 mg | INTRAVENOUS | Status: DC
  Filled 2023-01-22: qty 5

## 2023-01-22 MED ORDER — SENNOSIDES-DOCUSATE SODIUM 8.6-50 MG PO TABS: 1.0000 | ORAL_TABLET | Freq: Every evening | ORAL | Status: DC | PRN

## 2023-01-22 MED ORDER — MENTHOL 3 MG MT LOZG: 1.0000 | LOZENGE | OROMUCOSAL | Status: DC | PRN

## 2023-01-22 MED ORDER — LATANOPROST 0.005 % OP SOLN: 1.0000 [drp] | Freq: Every day | OPHTHALMIC | Status: DC

## 2023-01-22 MED ORDER — UMECLIDINIUM BROMIDE 62.5 MCG/ACT IN AEPB
1.0000 | INHALATION_SPRAY | Freq: Every day | RESPIRATORY_TRACT | Status: DC
  Filled 2023-01-22: qty 7

## 2023-01-22 MED ORDER — IPRATROPIUM-ALBUTEROL 0.5-2.5 (3) MG/3ML IN SOLN
3.0000 mL | Freq: Once | RESPIRATORY_TRACT | Status: AC
  Administered 2023-01-22: 3 mL via RESPIRATORY_TRACT
  Filled 2023-01-22: qty 3

## 2023-01-22 MED ORDER — PROPRANOLOL HCL 20 MG PO TABS
40.0000 mg | ORAL_TABLET | Freq: Two times a day (BID) | ORAL | Status: DC
  Administered 2023-01-22: 40 mg via ORAL
  Filled 2023-01-22 (×2): qty 2

## 2023-01-22 MED ORDER — SODIUM CHLORIDE 0.9 % IV SOLN
2.0000 g | Freq: Once | INTRAVENOUS | Status: AC
  Administered 2023-01-22: 2 g via INTRAVENOUS
  Filled 2023-01-22: qty 20

## 2023-01-22 MED ORDER — CYCLOSPORINE 0.05 % OP EMUL
1.0000 [drp] | Freq: Two times a day (BID) | OPHTHALMIC | Status: DC
  Administered 2023-01-22: 1 [drp] via OPHTHALMIC
  Filled 2023-01-22 (×3): qty 30

## 2023-01-22 MED ORDER — HYDROCOD POLI-CHLORPHE POLI ER 10-8 MG/5ML PO SUER
5.0000 mL | Freq: Every evening | ORAL | Status: DC | PRN
  Administered 2023-01-23: 5 mL via ORAL
  Filled 2023-01-22: qty 5

## 2023-01-22 NOTE — Hospital Course (Signed)
[name] is a 89 year old male with history of acute myeloid leukemia, hypertension, hyperlipidemia, COPD, GERD, failure to thrive, frequent severe acute on chronic anemia requiring transfusion, who presents emergency department for chief concerns of weakness and anemia. Patient received 1 unit PRBC transfusion, hemoglobin dropped down to 6.3 again.  Patient has been seen by oncology, condition is terminal, recommended transition to hospice care. Discussion with the family and the patient, transition to comfort care.

## 2023-01-22 NOTE — Assessment & Plan Note (Signed)
-   Check lactic acid x 2 - Azithromycin 500 mg IV daily, 5 total doses ordered; ceftriaxone 2 g IV daily, 5 total doses were ordered

## 2023-01-22 NOTE — ED Triage Notes (Addendum)
Pt arrived to ER sent from [facility] for weakness. Pt has been dealing with low hemoglobin for a few weeks and has had multiple units of blood transfused within the last 4 weeks. Pt family states he can not walk or stand up like normal. Pt has been feeling more short of breath as well. Pt appears pale. Pt is waiting for results from bone marrow biopsy.

## 2023-01-22 NOTE — Assessment & Plan Note (Addendum)
-   Propranolol 20 mg 4 times daily resumed on admission - Hydralazine 5 mg IV every 8 hours as needed for SBP greater 175, 4 days ordered

## 2023-01-22 NOTE — H&P (Addendum)
History and Physical   Derek Haney [id number] DOB: 1933/06/06 DOA: 01/22/2023  PCP: Derek Aus, MD  Outpatient Specialists: Dr. Marius Haney, gastroenterology service; Dr. Stevphen Haney clinic cardiology Patient coming from: [facility] clinic  I have personally briefly reviewed patient's old medical records in [place].  Chief Concern: Weakness, fatigue  HPI: [name] is a 89 year old male with history of acute myeloid leukemia, hypertension, hyperlipidemia, COPD, GERD, failure to thrive, frequent severe acute on chronic anemia requiring transfusion, who presents emergency department for chief concerns of weakness and anemia.  Vitals in the ED showed temperature of 98.7, respiration rate of 24, heart rate 83, blood pressure 139/63, SpO2 of 92% on 2 L nasal cannula.  Serum sodium is 132, potassium 3.6, chloride 100, bicarb 21, BUN of 16, serum creatinine of 0.90, EGFR greater than 60, nonfasting blood glucose 156, WBC 4.1, hemoglobin 6.1, platelets of 162.  ED treatment: DuoNebs one-time dose, azithromycin 500 mg IV one-time dose, ceftriaxone 2 g IV one-time dose.  EDP also ordered type and screen and 1 unit PRBC for transfusion. ------------------------------ At bedside, patient was awake alert and oriented to self, age, location.  He appears pale and weak.  Family were in the room with my evaluation at bedside.   Patient denies anything bothering him.   Sons Derek Haney and Derek Haney were at bedside, grandson Derek Haney), and granddaughter in Sports coach, Derek Haney were at bedside.   Family confirms that patient is DNR. They wish to speak with oncologist and palliative regarding hospice options as they feel he is too weak and unable to care for himself at home.   Social history: He lives at home on his own. He is a former tobacco and etoh user. He denies recreational drug use.  ROS: Constitutional: no weight change, no fever ENT/Mouth: no sore throat, no rhinorrhea Eyes: no eye  pain, no vision changes Cardiovascular: no chest pain, no dyspnea,  no edema, no palpitations Respiratory: no cough, no sputum, no wheezing Gastrointestinal: no nausea, no vomiting, no diarrhea, no constipation Genitourinary: no urinary incontinence, no dysuria, no hematuria Musculoskeletal: no arthralgias, no myalgias Skin: no skin lesions, no pruritus, Neuro: + weakness, no loss of consciousness, no syncope Psych: no anxiety, no depression, + decrease appetite, + fatigue Heme/Lymph: no bruising, no bleeding  ED Course: Discussed with emergency medicine provider, patient requiring hospitalization for chief concerns of severe acute on chronic symptomatic anemia.  Assessment/Plan  Principal Problem:   Symptomatic anemia Active Problems:   CAD (coronary artery disease)   Benign essential hypertension   HLD (hyperlipidemia)   GERD (gastroesophageal reflux disease)   Pulmonary hypertension, moderate to severe (HCC)   Weakness   Failure to thrive in adult   DNR (do not resuscitate)   CAP (community acquired pneumonia)   AML (acute myeloblastic leukemia) (Derek Haney)   Assessment and Plan:  * Symptomatic anemia Severe acute on chronic symptomatic anemia - Patient has been typed and screened with 1 unit for transfusion ordered - Goal hemoglobin is returned to baseline at 7.1-7.8, which were patient's baseline for 3 weeks - Of note in 2022, patient was noted to have baseline hemoglobin of 11.0-11.1 - Repeat hemoglobin/hematocrit at 1900 on day of admission - CBC in a.m.  CAD (coronary artery disease) - Aspirin not resumed on admission due to acute on chronic severe anemia - Atorvastatin 20 mg daily, Imdur 30 mg 3 times daily, propranolol 20 mg 4 times daily resumed  Benign essential hypertension - Propranolol 20 mg 4 times daily  resumed on admission - Hydralazine 5 mg IV every 8 hours as needed for SBP greater 175, 4 days ordered  HLD (hyperlipidemia) - Atorvastatin 20 mg daily  resumed  GERD (gastroesophageal reflux disease) - Protonix 40 mg IV twice daily, 5 days were ordered  AML (acute myeloblastic leukemia) (Derek Haney) - Oncology specialist has been consulted, Dr. Janese Haney  CAP (community acquired pneumonia) - Check lactic acid x 2 - Azithromycin 500 mg IV daily, 5 total doses ordered; ceftriaxone 2 g IV daily, 5 total doses were ordered  DNR (do not resuscitate) Confirmed with family at bedside  Failure to thrive in adult - Spiritual care/chaplain has been consulted - Palliative care has been consulted for hospice discussion at the request of family  Chart reviewed.   DVT prophylaxis: TED hose Code Status: DNR, confirmed with sons Derek Haney and [place] (HPOA)), and grandson, Derek Haney Diet: regular diet Family Communication: updated family in the room Disposition Plan: pending clinical course Consults called: palliative, oncology Admission status: inpatient, telemetry  Past Medical History:  Diagnosis Date   Anemia    Cancer ([place])    basal cell   CHF (congestive heart failure) ([place])    Coronary artery disease    Dysrhythmia    GERD (gastroesophageal reflux disease)    Glaucoma (increased eye pressure)    Hypertension    Moderate COPD (chronic obstructive pulmonary disease) ([place]) 06/19/2018   Myocardial infarction [facility])    Wears dentures    partial upper   Past Surgical History:  Procedure Laterality Date   CATARACT EXTRACTION W/PHACO Left 01/17/2022   Procedure: CATARACT EXTRACTION PHACO AND INTRAOCULAR LENS PLACEMENT ([employer]) LEFT MALYUGIN VISION BLUE KAHOOK DUAL BLADE GONIOTOMY;  Surgeon: Leandrew Koyanagi, MD;  Location: [place];  Service: Ophthalmology;  Laterality: Left;  13.73 01:56.5   COLONOSCOPY WITH PROPOFOL N/A 12/30/2022   Procedure: COLONOSCOPY WITH PROPOFOL;  Surgeon: Lin Landsman, MD;  Location: [facility] ENDOSCOPY;  Service: Gastroenterology;  Laterality: N/A;   CORONARY ANGIOPLASTY     ESOPHAGOGASTRODUODENOSCOPY (EGD) WITH  PROPOFOL N/A 12/30/2022   Procedure: ESOPHAGOGASTRODUODENOSCOPY (EGD) WITH PROPOFOL;  Surgeon: Lin Landsman, MD;  Location: [facility] ENDOSCOPY;  Service: Gastroenterology;  Laterality: N/A;   TONSILLECTOMY     TOTAL HIP ARTHROPLASTY Right 03/21/2016   Procedure: TOTAL HIP ARTHROPLASTY;  Surgeon: [name], MD;  Location: [facility] ORS;  Service: Orthopedics;  Laterality: Right;   Social History:  reports that he quit smoking about 24 years ago. His smoking use included cigarettes. He has a 15.00 pack-year smoking history. He quit smokeless tobacco use about 24 years ago.  His smokeless tobacco use included chew. He reports that he does not currently use alcohol. He reports that he does not use drugs.  Allergies  Allergen Reactions   Aspirin Other (See Comments)    Occult blood in stool; higher doses- Pt can take small dose coated tablet   Penicillins Rash   Family History  Problem Relation Age of Onset   Diabetes Child    Family history: Family history reviewed and not pertinent.  Prior to Admission medications   Medication Sig Start Date End Date Taking? Authorizing Provider  albuterol (PROVENTIL HFA;VENTOLIN HFA) 108 (90 Base) MCG/ACT inhaler Inhale 1-2 puffs into the lungs every 6 (six) hours as needed for wheezing or shortness of breath. 06/03/18  Yes Wilhelmina Mcardle, MD  ASPIRIN 81 PO Take 1 tablet by mouth daily.   Yes [provider]  atorvastatin (LIPITOR) 20 MG tablet Take 20 mg by  mouth daily at 6 PM.   Yes [provider]  bimatoprost (LUMIGAN) 0.01 % SOLN Place 1 drop into both eyes at bedtime.   Yes [provider]  cycloSPORINE (RESTASIS) 0.05 % ophthalmic emulsion Place 1 drop into both eyes 2 (two) times daily.   Yes [provider]  dorzolamide (TRUSOPT) 2 % ophthalmic solution Place 1 drop into both eyes 2 (two) times daily.   Yes [provider]  isosorbide mononitrate (IMDUR) 30 MG 24 hr tablet Take 30 mg by mouth 3 (three)  times daily. 12/18/22  Yes [provider]  oxymetazoline (NASAL RELIEF) 0.05 % nasal spray Place 1 spray into both nostrils 2 (two) times daily as needed.   Yes [provider]  Polyethyl Glycol-Propyl Glycol (SYSTANE FREE OP) Apply 2 drops to eye in the morning and at bedtime.   Yes [provider]  potassium chloride SA (K-DUR,KLOR-CON) 20 MEQ tablet Take 20 mEq by mouth daily.   Yes [provider]  propranolol (INDERAL) 20 MG tablet Take 20 mg by mouth 4 (four) times daily.   Yes [provider]  sucralfate (CARAFATE) 1 g tablet Take 1 g by mouth 4 (four) times daily.   Yes [provider]  TRELEGY ELLIPTA 100-62.5-25 MCG/INH AEPB INHALE 1 PUFFS BY MOUTH ONCE DAILY 04/08/19  Yes Wilhelmina Mcardle, MD  cyanocobalamin 1000 MCG tablet Take 1 tablet (1,000 mcg total) by mouth daily. Patient not taking: Reported on 01/22/2023 12/30/22 03/30/23  Terrilee Croak, MD  famotidine (PEPCID) 40 MG tablet Take 1 tablet by mouth at bedtime. Patient not taking: Reported on 01/22/2023 01/11/23 01/11/24  [provider]  ferrous sulfate 325 (65 FE) MG tablet Take 325 mg by mouth daily with breakfast. Patient not taking: Reported on 01/22/2023    [provider]  pantoprazole (PROTONIX) 40 MG tablet Take 1 tablet (40 mg total) by mouth daily. Patient not taking: Reported on 01/22/2023 12/30/22 03/30/23  Terrilee Croak, MD  traZODone (DESYREL) 50 MG tablet Take 100 mg by mouth at bedtime. Patient not taking: Reported on 01/22/2023    [provider]    Physical Exam: Vitals:   01/22/23 1417 01/22/23 1523 01/22/23 1526 01/22/23 1704  BP: (!) 124/51 (!) 86/57 (!) 114/47 (!) 98/54  Pulse: 71 79  79  Resp: '20 19  20  '$ Temp: 98.9 F (37.2 C) 97.8 F (36.6 C)  99 F (37.2 C)  TempSrc:      SpO2: 99% 99%  100%  Weight:      Height:       Constitutional: appears frail, weak, NAD, calm, comfortable Eyes: PERRL, lids and conjunctivae normal ENMT:  Mucous membranes are moist. Posterior pharynx clear of any exudate or lesions. Age-appropriate dentition. Hearing appropriate Neck: normal, supple, no masses, no thyromegaly Respiratory: clear to auscultation bilaterally, no wheezing, no crackles. Normal respiratory effort. No accessory muscle use.  Cardiovascular: Regular rate and rhythm, no murmurs / rubs / gallops. No extremity edema. 2+ pedal pulses. No carotid bruits.  Abdomen: no tenderness, no masses palpated, no hepatosplenomegaly. Bowel sounds positive.  Musculoskeletal: no clubbing / cyanosis. No joint deformity upper and lower extremities. Good ROM, no contractures, no atrophy. Normal muscle tone.  Skin: pale skin consistent with severe acute anemia Neurologic: Sensation intact. Strength 5/5 in all 4.  Psychiatric: Unable to fully assess judgment and insight as patient is very weak. Alert and oriented x 3. Depressed mood.   EKG: independently reviewed, showing sinus rhythm with  rate of 86, QTc 454  Chest x-ray on Admission: I personally reviewed and I agree with radiologist reading as below.  DG Chest Portable 1 View  Result Date: 01/22/2023 CLINICAL DATA:  Shortness of breath, weakness EXAM: PORTABLE CHEST 1 VIEW COMPARISON:  12/29/2022 FINDINGS: New bilateral perihilar/infrahilar density with accentuated bandlike densities in the left perihilar region and peripheral linear opacities in the left mid lung increased from previous. Although Kerley B lines are not excluded, heart size is within normal limits for AP projection, and atypical pneumonia or noncardiogenic edema are differential diagnostic considerations. Atherosclerotic calcification of the aortic arch. No blunting of the costophrenic angles. IMPRESSION: 1. New bilateral perihilar/infrahilar density with accentuated bandlike densities in the left perihilar region and peripheral linear opacities in the left mid lung. Although Kerley B lines are not excluded, heart size is within  normal limits for AP projection. Atypical pneumonia or noncardiogenic edema are differential diagnostic considerations. 2.  Aortic Atherosclerosis (ICD10-I70.0). Electronically Signed   By: Van Clines M.D.   On: 01/22/2023 13:50    Labs on Admission: I have personally reviewed following labs  CBC: Recent Labs  Lab 01/22/23 1205  WBC 4.1  NEUTROABS 1.0*  HGB 6.1*  HCT 18.8*  MCV 97.4  PLT 0000000   Basic Metabolic Panel: Recent Labs  Lab 01/22/23 1205  NA 132*  K 3.6  CL 100  CO2 21*  GLUCOSE 156*  BUN 16  CREATININE 0.90  CALCIUM 8.5*   GFR: Estimated Creatinine Clearance: 58.6 mL/min (by C-G formula based on SCr of 0.9 mg/dL).  Liver Function Tests: Recent Labs  Lab 01/22/23 1205  AST 69*  ALT 65*  ALKPHOS 92  BILITOT 2.1*  PROT 7.1  ALBUMIN 2.7*   Coagulation Profile: Recent Labs  Lab 01/22/23 1205  INR 1.5*   Urine analysis:    Component Value Date/Time   COLORURINE YELLOW (A) 03/07/2016 1514   APPEARANCEUR CLEAR (A) 03/07/2016 1514   LABSPEC 1.017 03/07/2016 1514   PHURINE 5.0 03/07/2016 1514   GLUCOSEU NEGATIVE 03/07/2016 1514   HGBUR NEGATIVE 03/07/2016 1514   BILIRUBINUR NEGATIVE 03/07/2016 1514   KETONESUR NEGATIVE 03/07/2016 1514   PROTEINUR NEGATIVE 03/07/2016 1514   NITRITE NEGATIVE 03/07/2016 1514   LEUKOCYTESUR NEGATIVE 03/07/2016 1514   CRITICAL CARE Performed by: Dr. Tobie Poet  Total critical care time: 35 minutes  Critical care time was exclusive of separately billable procedures and treating other patients.  Critical care was necessary to treat or prevent imminent or life-threatening deterioration.  Critical care was time spent personally by me on the following activities: development of treatment plan with patient and/or surrogate as well as nursing, discussions with consultants, evaluation of patient's response to treatment, examination of patient, obtaining history from patient or surrogate, ordering and performing treatments  and interventions, ordering and review of laboratory studies, ordering and review of radiographic studies, pulse oximetry and re-evaluation of patient's condition.  This document was prepared using Dragon Voice Recognition software and may include unintentional dictation errors.  Dr. Tobie Poet [facility]  If 7PM-7AM, please contact overnight-coverage provider If 7AM-7PM, please contact day coverage provider [email or web address]  01/22/2023, 5:12 PM

## 2023-01-22 NOTE — Consult Note (Signed)
Consultation Note Date: 01/22/2023 at 1530  Patient Name: Derek Haney  DOB: May 07, 1933  MRN: [id number]  Age / Sex: 89 y.o., male  PCP: Rusty Aus, MD Referring Physician: Criss Alvine, DO  Reason for Consultation: Establishing goals of care  HPI/Patient Profile: 89 y.o. male  with past medical history of moderate COPD, HTN, type 2 diabetes, GERD, CHF, CAD, glaucoma, MI, and anemia admitted on 01/22/2023 with weakness.  On admission, hemoglobin was 6.2.  GI was consulted but did not recommend any additional workup as patient had EGD and colonoscopy with no active bleeding source.  Patient had outpatient bone marrow biopsy and results revealed hypercellular bone marrow with features of an evolving acute myeloid leukemia with circulating blasts.  Patient made aware of these results during admission process today.  Plan is for patient to meet with hematology today.  PMT was consulted to discuss goals and boundaries of care.  Clinical Assessment and Goals of Care: I have reviewed medical records including EPIC notes, labs and imaging, assessed the patient and then met with patient, his wife, his son Derek Haney, son Derek Haney, and grandson at bedside to discuss diagnosis prognosis, GOC, EOL wishes, disposition and options.  I introduced Palliative Medicine as specialized medical care for people living with serious illness. It focuses on providing relief from the symptoms and stress of a serious illness. The goal is to improve quality of life for both the patient and the family.  We discussed a brief life review of the patient.  Patient has been married for 62 years.  He was a Furniture conservator/restorer.  He is proud that he was able to walk a mile and a half and 45 minutes twice a week at the senior center.  He endorses he was very active and independent up until a few months ago.  We discussed patient's current  illness and what it means in the larger context of patient's on-going co-morbidities.  Education provided on AML as a aggressive form of leukemia.  Patient and family plan to meet with hematology today to further discuss treatment options and plan of care.  Symptoms assessed.  Patient complains of dryness and throat pain with cough.  Cough drops added as needed for comfort.  Patient has no other acute physical complaints at this time.  No adjustments needed tomorrow.  I attempted to elicit values and goals of care important to the patient.  Patient and family remain in agreement for DNR.  However, they are unsure of next steps.  They would like to speak with hematology before further discussing goals of care.  Family shared concern that if patient was to return home that he would need additional assistance.  Home health and hospice services briefly described.  Family is facing treatment option decisions, advanced directive, and anticipatory care needs.    Discussed with patient/family the importance of continued conversation with family and the medical providers regarding overall plan of care and treatment options, ensuring decisions are within the context of the patient's values  and GOCs.    Questions and concerns were addressed. The family was encouraged to call with questions or concerns.  PMT contact info given.  I plan to follow-up with family tomorrow after family and patient have been able to speak with hematology.  Primary Decision Maker PATIENT  Physical Exam Constitutional:      General: He is not in acute distress.    Appearance: He is normal weight.  HENT:     Head: Normocephalic.     Mouth/Throat:     Mouth: Mucous membranes are moist.  Eyes:     Pupils: Pupils are equal, round, and reactive to light.  Cardiovascular:     Rate and Rhythm: Normal rate.  Pulmonary:     Breath sounds: Wheezing present.     Comments: Strong, nonproductive cough Abdominal:     Palpations:  Abdomen is soft.  Musculoskeletal:     Comments: Generalized weakness  Neurological:     Mental Status: He is alert and oriented to person, place, and time.  Psychiatric:        Mood and Affect: Mood normal.        Behavior: Behavior normal.        Thought Content: Thought content normal.        Judgment: Judgment normal.     Palliative Assessment/Data:     Thank you for this consult. Palliative medicine will continue to follow and assist holistically.   Time Total: 75 minutes Greater than 50%  of this time was spent counseling and coordinating care related to the above assessment and plan.  Signed by: Jordan Hawks, DNP, FNP-BC Palliative Medicine    Please contact Palliative Medicine Team phone at [phone] for questions and concerns.  For individual provider: See Shea Evans

## 2023-01-22 NOTE — Consult Note (Signed)
Hematology/Oncology Consult note [facility] [phone] [phone]  Patient Care Team: Rusty Aus, MD as PCP - General (Internal Medicine)   Name of the patient: Derek Haney  [id number]  08-Apr-1933    Reason for referral- ***   Referring physician- ***  Date of visit: '@TODAY'$ @   History of presenting illness- ***  ECOG PS- ***  Pain scale- ***   Review of systems- ROS  Allergies  Allergen Reactions   Aspirin Other (See Comments)    Occult blood in stool; higher doses- Pt can take small dose coated tablet   Penicillins Rash    Patient Active Problem List   Diagnosis Date Noted   Failure to thrive in adult 01/22/2023   DNR (do not resuscitate) 01/22/2023   CAP (community acquired pneumonia) 01/22/2023   Other pancytopenia ([place]) 01/14/2023   GI bleed 01/11/2023   Polyp of colon 12/30/2022   Lower esophageal tear with hemorrhage 12/30/2022   Symptomatic anemia 12/29/2022   Pancytopenia ([place]) 12/29/2022   HLD (hyperlipidemia) 12/29/2022   GERD (gastroesophageal reflux disease) 12/29/2022   Acute respiratory failure with hypoxia (HCC)    Impaired fasting glucose    Hypocalcemia 05/11/2021   Hypokalemia 05/11/2021   Chronic respiratory failure with hypoxia ([place]) 05/11/2021   COPD exacerbation (HCC)    Weakness    Chronic diastolic CHF (congestive heart failure) (HCC)    Elevated glucose    MI (myocardial infarction) ([place]) 11/04/2018   Right bundle branch block 11/04/2018   CAD (coronary artery disease) 06/19/2018   Moderate COPD (chronic obstructive pulmonary disease) ([place]) 06/19/2018   Pulmonary hypertension, moderate to severe ([place]) 06/19/2018   Diabetes mellitus, type II ([place]) 06/19/2018   Bradycardia 01/16/2018   Gastroesophageal reflux disease without esophagitis 07/16/2016   Mixed hyperlipidemia 07/16/2016   S/P total hip arthroplasty 03/21/2016   Benign essential hypertension 04/15/2015   Glaucoma  06/10/2012     Past Medical History:  Diagnosis Date   Anemia    Cancer ([place])    basal cell   CHF (congestive heart failure) ([place])    Coronary artery disease    Dysrhythmia    GERD (gastroesophageal reflux disease)    Glaucoma (increased eye pressure)    Hypertension    Moderate COPD (chronic obstructive pulmonary disease) ([place]) 06/19/2018   Myocardial infarction [facility])    Wears dentures    partial upper     Past Surgical History:  Procedure Laterality Date   CATARACT EXTRACTION W/PHACO Left 01/17/2022   Procedure: CATARACT EXTRACTION PHACO AND INTRAOCULAR LENS PLACEMENT ([place]) LEFT MALYUGIN VISION BLUE KAHOOK DUAL BLADE GONIOTOMY;  Surgeon: Leandrew Koyanagi, MD;  Location: [place];  Service: Ophthalmology;  Laterality: Left;  13.73 01:56.5   COLONOSCOPY WITH PROPOFOL N/A 12/30/2022   Procedure: COLONOSCOPY WITH PROPOFOL;  Surgeon: Lin Landsman, MD;  Location: [facility] ENDOSCOPY;  Service: Gastroenterology;  Laterality: N/A;   CORONARY ANGIOPLASTY     ESOPHAGOGASTRODUODENOSCOPY (EGD) WITH PROPOFOL N/A 12/30/2022   Procedure: ESOPHAGOGASTRODUODENOSCOPY (EGD) WITH PROPOFOL;  Surgeon: Lin Landsman, MD;  Location: [facility] ENDOSCOPY;  Service: Gastroenterology;  Laterality: N/A;   TONSILLECTOMY     TOTAL HIP ARTHROPLASTY Right 03/21/2016   Procedure: TOTAL HIP ARTHROPLASTY;  Surgeon: [name], MD;  Location: [facility] ORS;  Service: Orthopedics;  Laterality: Right;    Social History   Socioeconomic History   Marital status: Married    Spouse name: Not on file   Number of children: Not on file  Years of education: Not on file   Highest education level: Not on file  Occupational History   Not on file  Tobacco Use   Smoking status: Former    Packs/day: 1.00    Years: 15.00    Total pack years: 15.00    Types: Cigarettes    Quit date: 2000    Years since quitting: 24.1   Smokeless tobacco: Former    Types: Chew    Quit date: 2000   Tobacco comments:     it's been over 20 since he quit  Vaping Use   Vaping Use: Never used  Substance and Sexual Activity   Alcohol use: Not Currently    Comment: occ   Drug use: No   Sexual activity: Not Currently  Other Topics Concern   Not on file  Social History Narrative   Not on file   Social Determinants of Health   Financial Resource Strain: Not on file  Food Insecurity: No Food Insecurity (01/22/2023)   Hunger Vital Sign    Worried About Running Out of Food in the Last Year: Never true    Ran Out of Food in the Last Year: Never true  Transportation Needs: No Transportation Needs (01/22/2023)   PRAPARE - Hydrologist (Medical): No    Lack of Transportation (Non-Medical): No  Physical Activity: Not on file  Stress: Not on file  Social Connections: Not on file  Intimate Partner Violence: Not At Risk (01/22/2023)   Humiliation, Afraid, Rape, and Kick questionnaire    Fear of Current or Ex-Partner: No    Emotionally Abused: No    Physically Abused: No    Sexually Abused: No     Family History  Problem Relation Age of Onset   Diabetes Child      Current Facility-Administered Medications:    acetaminophen (TYLENOL) tablet 650 mg, 650 mg, Oral, Q6H PRN **OR** acetaminophen (TYLENOL) suppository 650 mg, 650 mg, Rectal, Q6H PRN, Cox, Amy N, DO   albuterol (PROVENTIL) (2.5 MG/3ML) 0.083% nebulizer solution 2.5 mg, 2.5 mg, Nebulization, Q6H PRN, Cox, Amy N, DO   atorvastatin (LIPITOR) tablet 20 mg, 20 mg, Oral, q1800, Cox, Amy N, DO   azithromycin (ZITHROMAX) 500 mg in sodium chloride 0.9 % 250 mL IVPB, 500 mg, Intravenous, Once, Cox, Amy N, DO, Last Rate: 250 mL/hr at 01/22/23 1606, 500 mg at 01/22/23 1606   [START ON 01/23/2023] azithromycin (ZITHROMAX) 500 mg in sodium chloride 0.9 % 250 mL IVPB, 500 mg, Intravenous, Q24H, Cox, Amy N, DO   [START ON 01/23/2023] cefTRIAXone (ROCEPHIN) 2 g in sodium chloride 0.9 % 100 mL IVPB, 2 g, Intravenous, Q24H, Cox, Amy N, DO    chlorpheniramine-HYDROcodone (TUSSIONEX) 10-8 MG/5ML suspension 5 mL, 5 mL, Oral, QHS PRN, Cox, Amy N, DO   cycloSPORINE (RESTASIS) 0.05 % ophthalmic emulsion 1 drop, 1 drop, Both Eyes, BID, Cox, Amy N, DO   dorzolamide (TRUSOPT) 2 % ophthalmic solution 1 drop, 1 drop, Both Eyes, BID, Cox, Amy N, DO   [START ON 01/23/2023] fluticasone furoate-vilanterol (BREO ELLIPTA) 100-25 MCG/ACT 1 puff, 1 puff, Inhalation, Daily **AND** [START ON 01/23/2023] umeclidinium bromide (INCRUSE ELLIPTA) 62.5 MCG/ACT 1 puff, 1 puff, Inhalation, Daily, Cox, Amy N, DO   guaiFENesin (MUCINEX) 12 hr tablet 600 mg, 600 mg, Oral, BID PRN, Cox, Amy N, DO   hydrALAZINE (APRESOLINE) injection 5 mg, 5 mg, Intravenous, Q8H PRN, Cox, Amy N, DO   isosorbide mononitrate (IMDUR) 24 hr tablet 30  mg, 30 mg, Oral, TID, Cox, Amy N, DO   [START ON 01/23/2023] latanoprost (XALATAN) 0.005 % ophthalmic solution 1 drop, 1 drop, Both Eyes, QHS, Cox, Amy N, DO   menthol-cetylpyridinium (CEPACOL) lozenge 3 mg, 1 lozenge, Oral, PRN, Cox, Amy N, DO   ondansetron (ZOFRAN) tablet 4 mg, 4 mg, Oral, Q6H PRN **OR** ondansetron (ZOFRAN) injection 4 mg, 4 mg, Intravenous, Q6H PRN, Cox, Amy N, DO   oxymetazoline (AFRIN) 0.05 % nasal spray 1 spray, 1 spray, Each Nare, BID PRN, Cox, Amy N, DO   pantoprazole (PROTONIX) injection 40 mg, 40 mg, Intravenous, BID, Cox, Amy N, DO   propranolol (INDERAL) tablet 40 mg, 40 mg, Oral, BID, Cox, Amy N, DO   senna-docusate (Senokot-S) tablet 1 tablet, 1 tablet, Oral, QHS PRN, Cox, Amy N, DO   sucralfate (CARAFATE) tablet 1 g, 1 g, Oral, QID, Cox, Amy N, DO   Physical exam:  Vitals:   01/22/23 1402 01/22/23 1417 01/22/23 1523 01/22/23 1526  BP: 123/63 (!) 124/51 (!) 86/57 (!) 114/47  Pulse:  71 79   Resp: '18 20 19   '$ Temp:  98.9 F (37.2 C) 97.8 F (36.6 C)   TempSrc:      SpO2:  99% 99%   Weight:      Height:       Physical Exam        Latest Ref Rng & Units 01/22/2023   12:05 PM  CMP  Glucose 70 - 99 mg/dL  156   BUN 8 - 23 mg/dL 16   Creatinine 0.61 - 1.24 mg/dL 0.90   Sodium 135 - 145 mmol/L 132   Potassium 3.5 - 5.1 mmol/L 3.6   Chloride 98 - 111 mmol/L 100   CO2 22 - 32 mmol/L 21   Calcium 8.9 - 10.3 mg/dL 8.5   Total Protein 6.5 - 8.1 g/dL 7.1   Total Bilirubin 0.3 - 1.2 mg/dL 2.1   Alkaline Phos 38 - 126 U/L 92   AST 15 - 41 U/L 69   ALT 0 - 44 U/L 65       Latest Ref Rng & Units 01/22/2023   12:05 PM  CBC  WBC 4.0 - 10.5 K/uL 4.1   Hemoglobin 13.0 - 17.0 g/dL 6.1   Hematocrit 39.0 - 52.0 % 18.8   Platelets 150 - 400 K/uL 162     '@IMAGES'$ @  DG Chest Portable 1 View  Result Date: 01/22/2023 CLINICAL DATA:  Shortness of breath, weakness EXAM: PORTABLE CHEST 1 VIEW COMPARISON:  12/29/2022 FINDINGS: New bilateral perihilar/infrahilar density with accentuated bandlike densities in the left perihilar region and peripheral linear opacities in the left mid lung increased from previous. Although Kerley B lines are not excluded, heart size is within normal limits for AP projection, and atypical pneumonia or noncardiogenic edema are differential diagnostic considerations. Atherosclerotic calcification of the aortic arch. No blunting of the costophrenic angles. IMPRESSION: 1. New bilateral perihilar/infrahilar density with accentuated bandlike densities in the left perihilar region and peripheral linear opacities in the left mid lung. Although Kerley B lines are not excluded, heart size is within normal limits for AP projection. Atypical pneumonia or noncardiogenic edema are differential diagnostic considerations. 2.  Aortic Atherosclerosis (ICD10-I70.0). Electronically Signed   By: Van Clines M.D.   On: 01/22/2023 13:50   CT BONE MARROW BIOPSY & ASPIRATION  Result Date: 01/14/2023 INDICATION: Pancytopenia EXAM: CT BONE MARROW BIOPSY AND ASPIRATION MEDICATIONS: None. ANESTHESIA/SEDATION: Moderate (conscious) sedation was employed during this procedure.  A total of Versed 0.5 mg and Fentanyl  50 mcg was administered intravenously. Moderate Sedation Time: 10 minutes. The patient's level of consciousness and vital signs were monitored continuously by radiology nursing throughout the procedure under my direct supervision. FLUOROSCOPY TIME:  N/a COMPLICATIONS: None immediate. PROCEDURE: Informed written consent was obtained from the patient after a thorough discussion of the procedural risks, benefits and alternatives. All questions were addressed. Maximal Sterile Barrier Technique was utilized including caps, mask, sterile gowns, sterile gloves, sterile drape, hand hygiene and skin antiseptic. A timeout was performed prior to the initiation of the procedure. The patient was placed prone on the CT exam table. Limited CT of the pelvis was performed for planning purposes. Skin entry site was marked, and the overlying skin was prepped and draped in the standard sterile fashion. Local analgesia was obtained with 1% lidocaine. Using CT guidance, an 11 gauge needle was advanced just deep to the cortex of the right posterior ilium. Subsequently, bone marrow aspiration and core biopsy were performed. Specimens were submitted to lab/pathology for handling. Hemostasis was achieved with manual pressure, and a clean dressing was placed. The patient tolerated the procedure well without immediate complication. IMPRESSION: Successful CT-guided bone marrow aspiration and core biopsy of the right posterior ilium. Electronically Signed   By: Albin Felling M.D.   On: 01/14/2023 14:31   DG Chest Portable 1 View  Result Date: 12/29/2022 CLINICAL DATA:  chest pain/SOB EXAM: PORTABLE CHEST 1 VIEW COMPARISON:  Chest x-ray 05/11/2021, CT chest 11/21/2022 FINDINGS: The heart and mediastinal contours are unchanged. Aortic calcification. No focal consolidation. Chronic coarsened interstitial markings with no overt pulmonary edema. No pleural effusion. No pneumothorax. No acute osseous abnormality. IMPRESSION: 1. No active disease.  2. Aortic Atherosclerosis (ICD10-I70.0) and Emphysema (ICD10-J43.9). Electronically Signed   By: Iven Finn M.D.   On: 12/29/2022 02:28    Assessment and plan- Patient is a 89 y.o. male ***   Thank you for this kind referral and the opportunity to participate in the care of this  Patient   Visit Diagnosis 1. Acute myeloid leukemia not having achieved remission ([place])   2. Anemia, unspecified type     Dr. Randa Evens, MD, MPH [facility] at [address] [facility] [id number] 01/22/2023

## 2023-01-22 NOTE — Assessment & Plan Note (Signed)
-   Spiritual care/chaplain has been consulted - Palliative care has been consulted for hospice discussion at the request of family

## 2023-01-22 NOTE — Assessment & Plan Note (Addendum)
Severe acute on chronic symptomatic anemia - Patient has been typed and screened with 1 unit for transfusion ordered - Goal hemoglobin is returned to baseline at 7.1-7.8, which were patient's baseline for 3 weeks - Of note in 2022, patient was noted to have baseline hemoglobin of 11.0-11.1 - Repeat hemoglobin/hematocrit at 1900 on day of admission - CBC in a.m.

## 2023-01-22 NOTE — ED Provider Notes (Signed)
[facility] Provider Note    Event Date/Time   First MD Initiated Contact with Patient 01/22/23 1150     (approximate)   History   Weakness   HPI  Derek Haney is a 89 y.o. male past medical history of anemia, COPD, hypertension, hyperlipidemia, type 2 diabetes, GERD who presents because of weakness.  Notes that over the last several weeks he has been extremely weak fatigued and now is unable to get up and walk around.  He has difficulty even lifting up his head.  He does feel short of breath denies chest pain.  Has had a cough for the last week and multiple family emerged have cough.  Denies fevers chills nausea vomiting abdominal pain.  Patient was recently admitted for anemia.  Hemoglobin was 6.2 on presentation.  GI was consulted but did not recommend any additional workup as he is already had a EGD and colonoscopy with no active bleeding source.  Plan for video capsule endoscopy as an outpatient.  He was seen by oncology who recommended outpatient bone marrow biopsy which was performed.  Reviewed bone marrow biopsy results from 2/26 which show hypercellular bone marrow with features of an evolving acute myeloid leukemia with circulating blasts     Past Medical History:  Diagnosis Date   Anemia    Cancer ([place])    basal cell   CHF (congestive heart failure) (HCC)    Coronary artery disease    Dysrhythmia    GERD (gastroesophageal reflux disease)    Glaucoma (increased eye pressure)    Hypertension    Moderate COPD (chronic obstructive pulmonary disease) ([place]) 06/19/2018   Myocardial infarction [facility])    Wears dentures    partial upper    Patient Active Problem List   Diagnosis Date Noted   Other pancytopenia ([place]) 01/14/2023   GI bleed 01/11/2023   Polyp of colon 12/30/2022   Lower esophageal tear with hemorrhage 12/30/2022   Symptomatic anemia 12/29/2022   Pancytopenia ([place]) 12/29/2022   HLD (hyperlipidemia) 12/29/2022   GERD  (gastroesophageal reflux disease) 12/29/2022   Acute respiratory failure with hypoxia (HCC)    Impaired fasting glucose    Hypocalcemia 05/11/2021   Hypokalemia 05/11/2021   Chronic respiratory failure with hypoxia ([place]) 05/11/2021   COPD exacerbation (HCC)    Weakness    Chronic diastolic CHF (congestive heart failure) (HCC)    Elevated glucose    MI (myocardial infarction) ([place]) 11/04/2018   Right bundle branch block 11/04/2018   CAD (coronary artery disease) 06/19/2018   Moderate COPD (chronic obstructive pulmonary disease) ([place]) 06/19/2018   Pulmonary hypertension, moderate to severe ([place]) 06/19/2018   Diabetes mellitus, type II ([place]) 06/19/2018   Bradycardia 01/16/2018   Gastroesophageal reflux disease without esophagitis 07/16/2016   Mixed hyperlipidemia 07/16/2016   S/P total hip arthroplasty 03/21/2016   Benign essential hypertension 04/15/2015   Glaucoma 06/10/2012     Physical Exam  Triage Vital Signs: ED Triage Vitals [01/22/23 1141]  Enc Vitals Group     BP 139/63     Pulse Rate 85     Resp (!) 26     Temp 98.7 F (37.1 C)     Temp src      SpO2 94 %     Weight 189 lb 15.9 oz (86.2 kg)     Height '5\' 10"'$  (1.778 m)     Head Circumference      Peak Flow      Pain Score 0  Pain Loc      Pain Edu?      Excl. in [place]?     Most recent vital signs: Vitals:   01/22/23 1402 01/22/23 1415  BP: 123/63 (!) 122/58  Pulse:  76  Resp: 18 (!) 21  Temp:  98.9 F (37.2 C)  SpO2:  100%     General: Awake, patient looks ill very pale CV:  Good peripheral perfusion.  No edema Resp:  Normal effort.  Patient is mildly tachypneic with a wet sounding cough scattered expiratory wheezing but good air movement Abd:  No distention.  Neuro:             Awake, Alert, Oriented x 3  Other:     ED Results / Procedures / Treatments  Labs (all labs ordered are listed, but only abnormal results are displayed) Labs Reviewed  COMPREHENSIVE METABOLIC PANEL - Abnormal;  Notable for the following components:      Result Value   Sodium 132 (*)    CO2 21 (*)    Glucose, Bld 156 (*)    Calcium 8.5 (*)    Albumin 2.7 (*)    AST 69 (*)    ALT 65 (*)    Total Bilirubin 2.1 (*)    All other components within normal limits  CBC WITH DIFFERENTIAL/PLATELET - Abnormal; Notable for the following components:   RBC 1.93 (*)    Hemoglobin 6.1 (*)    HCT 18.8 (*)    nRBC 0.7 (*)    Neutro Abs 1.0 (*)    Monocytes Absolute 2.2 (*)    Abs Immature Granulocytes 0.14 (*)    All other components within normal limits  PROTIME-INR - Abnormal; Notable for the following components:   Prothrombin Time 18.1 (*)    INR 1.5 (*)    All other components within normal limits  APTT - Abnormal; Notable for the following components:   aPTT 43 (*)    All other components within normal limits  BRAIN NATRIURETIC PEPTIDE - Abnormal; Notable for the following components:   B Natriuretic Peptide 143.3 (*)    All other components within normal limits  RESP PANEL BY RT-PCR (RSV, FLU A&B, COVID)  [id number]  TYPE AND SCREEN  PREPARE RBC (CROSSMATCH)     EKG  EKG reviewed inter myself shows sinus rhythm with right bundle branch block no acute ischemic changes similar to prior   RADIOLOGY I reviewedand interpreted the chest x-ray which shows increased opacities in hilar regions bilaterally   PROCEDURES:  Critical Care performed: Yes, see critical care procedure note(s)  .1-3 Lead EKG Interpretation  Performed by: Rada Hay, MD Authorized by: Rada Hay, MD     Interpretation: normal     ECG rate assessment: normal     Rhythm: sinus rhythm     Ectopy: none     Conduction: normal   .Critical Care  Performed by: Rada Hay, MD Authorized by: Rada Hay, MD   Critical care provider statement:    Critical care time (minutes):  30   Critical care was time spent personally by me on the following activities:  Development of treatment plan with  patient or surrogate, discussions with consultants, evaluation of patient's response to treatment, examination of patient, ordering and review of laboratory studies, ordering and review of radiographic studies, ordering and performing treatments and interventions, pulse oximetry, re-evaluation of patient's condition and review of old charts   The patient is on the cardiac monitor  to evaluate for evidence of arrhythmia and/or significant heart rate changes.   MEDICATIONS ORDERED IN ED: Medications  cefTRIAXone (ROCEPHIN) 2 g in sodium chloride 0.9 % 100 mL IVPB (2 g Intravenous New Bag/Given 01/22/23 1421)  azithromycin (ZITHROMAX) 500 mg in sodium chloride 0.9 % 250 mL IVPB (has no administration in time range)  ipratropium-albuterol (DUONEB) 0.5-2.5 (3) MG/3ML nebulizer solution 3 mL (3 mLs Nebulization Given 01/22/23 1422)  ipratropium-albuterol (DUONEB) 0.5-2.5 (3) MG/3ML nebulizer solution 3 mL (3 mLs Nebulization Given 01/22/23 1422)  0.9 %  sodium chloride infusion (0 mL/hr Intravenous Stopped 01/22/23 1407)     IMPRESSION / MDM / ASSESSMENT AND PLAN / ED COURSE  I reviewed the triage vital signs and the nursing notes.                              Patient's presentation is most consistent with acute presentation with potential threat to life or bodily function.  Differential diagnosis includes, but is not limited to, symptomatic anemia, COPD exacerbation, pneumonia  The patient is a 89 year old male with recent bone marrow biopsy showing new diagnosis of AML who presents today because of progressive weakness.  Patient is now so weak is unable to stand up unassisted has difficulty even lifting up his head.  He has progressive dyspnea on exertion and now has cough for the last week or so.  On the initial triage his sat is 88% placed on 2 L.  Family says that he does have oxygen at home that he will occasionally use.  Patient looks very pale and chronically ill somewhat dyspneic with a wet  sounding cough.  He is tachypneic other than his hypoxia and tachypnea rest of his vitals are reassuring.  I reviewed the bone marrow biopsy results which patient was not yet informed of and it does suggest AML.  CBC is still pending.  Patient will require admission. Dr. Janese Banks with Oncology who I spoke with will see the patient.        FINAL CLINICAL IMPRESSION(S) / ED DIAGNOSES   Final diagnoses:  Acute myeloid leukemia not having achieved remission (HCC)  Anemia, unspecified type     Rx / DC Orders   ED Discharge Orders     None        Note:  This document was prepared using Dragon voice recognition software and may include unintentional dictation errors.   Rada Hay, MD 01/22/23 [phone]

## 2023-01-22 NOTE — Assessment & Plan Note (Signed)
-   Oncology specialist has been consulted, Dr. Janese Banks

## 2023-01-22 NOTE — Assessment & Plan Note (Signed)
-   Atorvastatin 20 mg daily resumed 

## 2023-01-22 NOTE — Assessment & Plan Note (Signed)
-   Protonix 40 mg IV twice daily, 5 days were ordered

## 2023-01-22 NOTE — Progress Notes (Signed)
   01/22/23 1900  Spiritual Encounters  Type of Visit Initial  Care provided to: Pt and family  Referral source Nurse (RN/NT/LPN)  OnCall Visit Yes  Spiritual Framework  Presenting Themes Significant life change   Chaplain responded to [facility] consult of patient recently placed on Palliative care. Topics of difficult transitions, grief and loss were discussed in meaningful and reflective way.

## 2023-01-22 NOTE — Assessment & Plan Note (Signed)
Confirmed with family at bedside

## 2023-01-22 NOTE — Assessment & Plan Note (Signed)
-   Aspirin not resumed on admission due to acute on chronic severe anemia - Atorvastatin 20 mg daily, Imdur 30 mg 3 times daily, propranolol 20 mg 4 times daily resumed

## 2023-01-23 ENCOUNTER — Encounter (HOSPITAL_COMMUNITY): Payer: Self-pay | Admitting: Oncology

## 2023-01-23 DIAGNOSIS — Z66 Do not resuscitate: Secondary | ICD-10-CM

## 2023-01-23 DIAGNOSIS — R627 Adult failure to thrive: Secondary | ICD-10-CM

## 2023-01-23 DIAGNOSIS — K219 Gastro-esophageal reflux disease without esophagitis: Secondary | ICD-10-CM

## 2023-01-23 DIAGNOSIS — E871 Hypo-osmolality and hyponatremia: Secondary | ICD-10-CM | POA: Insufficient documentation

## 2023-01-23 DIAGNOSIS — D61818 Other pancytopenia: Secondary | ICD-10-CM | POA: Diagnosis not present

## 2023-01-23 DIAGNOSIS — D649 Anemia, unspecified: Secondary | ICD-10-CM | POA: Diagnosis not present

## 2023-01-23 LAB — TYPE AND SCREEN
ABO/RH(D): B POS
Antibody Screen: NEGATIVE
Unit division: 0

## 2023-01-23 LAB — BASIC METABOLIC PANEL
Anion gap: 7 (ref 5–15)
BUN: 14 mg/dL (ref 8–23)
CO2: 24 mmol/L (ref 22–32)
Calcium: 8.2 mg/dL — ABNORMAL LOW (ref 8.9–10.3)
Chloride: 103 mmol/L (ref 98–111)
Creatinine, Ser: 0.79 mg/dL (ref 0.61–1.24)
GFR, Estimated: >60 mL/min (ref >60)
Glucose, Bld: 122 mg/dL — ABNORMAL HIGH (ref 70–99)
Potassium: 3.2 mmol/L — ABNORMAL LOW (ref 3.5–5.1)
Sodium: 134 mmol/L — ABNORMAL LOW (ref 135–145)

## 2023-01-23 LAB — CBC
HCT: 18.5 % — ABNORMAL LOW (ref 39.0–52.0)
Hemoglobin: 6.3 g/dL — ABNORMAL LOW (ref 13.0–17.0)
MCH: 31.5 pg (ref 26.0–34.0)
MCHC: 34.1 g/dL (ref 30.0–36.0)
MCV: 92.5 fL (ref 80.0–100.0)
Platelets: 106 10*3/uL — ABNORMAL LOW (ref 150–400)
RBC: 2 MIL/uL — ABNORMAL LOW (ref 4.22–5.81)
RDW: 15.1 % (ref 11.5–15.5)
WBC: 3.5 10*3/uL — ABNORMAL LOW (ref 4.0–10.5)
nRBC: 0.6 % — ABNORMAL HIGH (ref 0.0–0.2)

## 2023-01-23 LAB — BPAM RBC
Blood Product Expiration Date: 202403222359
ISSUE DATE / TIME: 202403051355
Unit Type and Rh: 7300

## 2023-01-23 MED ORDER — POTASSIUM CHLORIDE 20 MEQ PO PACK
40.0000 meq | PACK | ORAL | Status: DC
  Administered 2023-01-23: 40 meq via ORAL
  Filled 2023-01-23: qty 2

## 2023-01-23 MED ORDER — HYDROCOD POLI-CHLORPHE POLI ER 10-8 MG/5ML PO SUER
5.0000 mL | Freq: Two times a day (BID) | ORAL | Status: DC
  Administered 2023-01-23: 5 mL via ORAL
  Filled 2023-01-23: qty 5

## 2023-01-23 MED ORDER — GLYCOPYRROLATE 0.2 MG/ML IJ SOLN: 0.2000 mg | INTRAMUSCULAR | Status: DC | PRN

## 2023-01-23 MED ORDER — ONDANSETRON HCL 4 MG PO TABS: 4.0000 mg | ORAL_TABLET | Freq: Four times a day (QID) | ORAL | 0 refills | Status: DC | PRN

## 2023-01-23 MED ORDER — HALOPERIDOL 0.5 MG PO TABS: 0.5000 mg | ORAL_TABLET | ORAL | Status: DC | PRN

## 2023-01-23 MED ORDER — HALOPERIDOL LACTATE 2 MG/ML PO CONC: 0.5000 mg | ORAL | Status: DC | PRN

## 2023-01-23 MED ORDER — GLYCOPYRROLATE 1 MG PO TABS: 1.0000 mg | ORAL_TABLET | ORAL | Status: DC | PRN

## 2023-01-23 MED ORDER — POLYVINYL ALCOHOL 1.4 % OP SOLN: 1.0000 [drp] | Freq: Four times a day (QID) | OPHTHALMIC | Status: DC | PRN

## 2023-01-23 MED ORDER — BIOTENE DRY MOUTH MT LIQD: 15.0000 mL | OROMUCOSAL | Status: DC | PRN

## 2023-01-23 MED ORDER — HALOPERIDOL LACTATE 5 MG/ML IJ SOLN: 0.5000 mg | INTRAMUSCULAR | Status: DC | PRN

## 2023-01-23 NOTE — Discharge Summary (Signed)
Physician Discharge Summary   Patient: Derek Haney MRN: [id number] DOB: 10-Aug-1933  Admit date:     01/22/2023  Discharge date: 01/23/23  Discharge Physician: Sharen Hones   PCP: Rusty Aus, MD   Recommendations at discharge:    Follow with hospicie  Discharge Diagnoses: Principal Problem:   Symptomatic anemia Active Problems:   Pancytopenia (HCC)   CAD (coronary artery disease)   Benign essential hypertension   HLD (hyperlipidemia)   GERD (gastroesophageal reflux disease)   Pulmonary hypertension, moderate to severe (HCC)   Hypokalemia   Weakness   Failure to thrive in adult   DNR (do not resuscitate)   CAP (community acquired pneumonia)   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 [facility] Course: Derek Haney is a 89 year old male with history of acute myeloid leukemia, hypertension, hyperlipidemia, COPD, GERD, failure to thrive, frequent severe acute on chronic anemia requiring transfusion, who presents emergency department for chief concerns of weakness and anemia. Patient received 1 unit PRBC transfusion, hemoglobin dropped down to 6.3 again.  Patient has been seen by oncology, condition is terminal, recommended transition to hospice care. Discussion with the family and the patient, transition to comfort care.  Assessment and Plan: Symptomatic anemia Pancytopenia. AML (acute myeloblastic leukemia) ([place]) Condition terminal, transition to comfort care.  He accepted to inpatient hospice.  CAD (coronary artery disease) Benign essential hypertension HLD (hyperlipidemia) No additional treatment.   CAP (community acquired pneumonia) No additional treatment.   Failure to thrive in adult         Consultants: Oncology Procedures performed: None  Disposition: Hospice care Diet recommendation:  Discharge Diet Orders (From admission, onward)     Start     Ordered   01/23/23 0000   Diet - low sodium heart healthy        01/23/23 1625           Regular diet DISCHARGE MEDICATION: Allergies as of 01/23/2023       Reactions   Aspirin Other (See Comments)   Occult blood in stool; higher doses- Pt can take small dose coated tablet   Penicillins Rash        Medication List     STOP taking these medications    ASPIRIN 81 PO   atorvastatin 20 MG tablet Commonly known as: LIPITOR   cyanocobalamin 1000 MCG tablet   famotidine 40 MG tablet Commonly known as: PEPCID   ferrous sulfate 325 (65 FE) MG tablet   isosorbide mononitrate 30 MG 24 hr tablet Commonly known as: IMDUR   Nasal Relief 0.05 % nasal spray Generic drug: oxymetazoline   pantoprazole 40 MG tablet Commonly known as: PROTONIX   potassium chloride SA 20 MEQ tablet Commonly known as: KLOR-CON M   propranolol 20 MG tablet Commonly known as: INDERAL   sucralfate 1 g tablet Commonly known as: CARAFATE   traZODone 50 MG tablet Commonly known as: DESYREL   Trelegy Ellipta 100-62.5-25 MCG/ACT Aepb Generic drug: Fluticasone-Umeclidin-Vilant       TAKE these medications    albuterol 108 (90 Base) MCG/ACT inhaler Commonly known as: VENTOLIN HFA Inhale 1-2 puffs into the lungs every 6 (six) hours as needed for wheezing or shortness of breath.   bimatoprost 0.01 % Soln Commonly known as: LUMIGAN Place 1 drop into both eyes at bedtime.   cycloSPORINE 0.05 % ophthalmic emulsion Commonly known as: RESTASIS Place 1 drop into both eyes 2 (two)  times daily.   dorzolamide 2 % ophthalmic solution Commonly known as: TRUSOPT Place 1 drop into both eyes 2 (two) times daily.   glycopyrrolate 1 MG tablet Commonly known as: ROBINUL Take 1 tablet (1 mg total) by mouth every 4 (four) hours as needed (excessive secretions).   haloperidol 0.5 MG tablet Commonly known as: HALDOL Take 1 tablet (0.5 mg total) by mouth every 4 (four) hours as needed for agitation (or delirium).   ondansetron  4 MG tablet Commonly known as: ZOFRAN Take 1 tablet (4 mg total) by mouth every 6 (six) hours as needed for nausea.   SYSTANE FREE OP Apply 2 drops to eye in the morning and at bedtime.        Discharge Exam: Filed Weights   01/22/23 1141  Weight: 86.2 kg   General exam: Appears pale, chronically ill  Respiratory system: Clear to auscultation. Respiratory effort normal. Cardiovascular system: S1 & S2 heard, RRR. No JVD, murmurs, rubs, gallops or clicks. No pedal edema. Gastrointestinal system: Abdomen is nondistended, soft and nontender. No organomegaly or masses felt. Normal bowel sounds heard. Central nervous system: Alert and oriented x2. No focal neurological deficits. Extremities: Symmetric 5 x 5 power. Skin: No rashes, lesions or ulcers Psychiatry:  Mood & affect appropriate.    Condition at discharge: poor  The results of significant diagnostics from this hospitalization (including imaging, microbiology, ancillary and laboratory) are listed below for reference.   Imaging Studies: DG Chest Portable 1 View  Result Date: 01/22/2023 CLINICAL DATA:  Shortness of breath, weakness EXAM: PORTABLE CHEST 1 VIEW COMPARISON:  12/29/2022 FINDINGS: New bilateral perihilar/infrahilar density with accentuated bandlike densities in the left perihilar region and peripheral linear opacities in the left mid lung increased from previous. Although Kerley B lines are not excluded, heart size is within normal limits for AP projection, and atypical pneumonia or noncardiogenic edema are differential diagnostic considerations. Atherosclerotic calcification of the aortic arch. No blunting of the costophrenic angles. IMPRESSION: 1. New bilateral perihilar/infrahilar density with accentuated bandlike densities in the left perihilar region and peripheral linear opacities in the left mid lung. Although Kerley B lines are not excluded, heart size is within normal limits for AP projection. Atypical pneumonia or  noncardiogenic edema are differential diagnostic considerations. 2.  Aortic Atherosclerosis (ICD10-I70.0). Electronically Signed   By: Van Clines M.D.   On: 01/22/2023 13:50   CT BONE MARROW BIOPSY & ASPIRATION  Result Date: 01/14/2023 INDICATION: Pancytopenia EXAM: CT BONE MARROW BIOPSY AND ASPIRATION MEDICATIONS: None. ANESTHESIA/SEDATION: Moderate (conscious) sedation was employed during this procedure. A total of Versed 0.5 mg and Fentanyl 50 mcg was administered intravenously. Moderate Sedation Time: 10 minutes. The patient's level of consciousness and vital signs were monitored continuously by radiology nursing throughout the procedure under my direct supervision. FLUOROSCOPY TIME:  N/a COMPLICATIONS: None immediate. PROCEDURE: Informed written consent was obtained from the patient after a thorough discussion of the procedural risks, benefits and alternatives. All questions were addressed. Maximal Sterile Barrier Technique was utilized including caps, mask, sterile gowns, sterile gloves, sterile drape, hand hygiene and skin antiseptic. A timeout was performed prior to the initiation of the procedure. The patient was placed prone on the CT exam table. Limited CT of the pelvis was performed for planning purposes. Skin entry site was marked, and the overlying skin was prepped and draped in the standard sterile fashion. Local analgesia was obtained with 1% lidocaine. Using CT guidance, an 11 gauge needle was advanced just deep to the cortex of  the right posterior ilium. Subsequently, bone marrow aspiration and core biopsy were performed. Specimens were submitted to lab/pathology for handling. Hemostasis was achieved with manual pressure, and a clean dressing was placed. The patient tolerated the procedure well without immediate complication. IMPRESSION: Successful CT-guided bone marrow aspiration and core biopsy of the right posterior ilium. Electronically Signed   By: Albin Felling M.D.   On:  01/14/2023 14:31   DG Chest Portable 1 View  Result Date: 12/29/2022 CLINICAL DATA:  chest pain/SOB EXAM: PORTABLE CHEST 1 VIEW COMPARISON:  Chest x-ray 05/11/2021, CT chest 11/21/2022 FINDINGS: The heart and mediastinal contours are unchanged. Aortic calcification. No focal consolidation. Chronic coarsened interstitial markings with no overt pulmonary edema. No pleural effusion. No pneumothorax. No acute osseous abnormality. IMPRESSION: 1. No active disease. 2. Aortic Atherosclerosis (ICD10-I70.0) and Emphysema (ICD10-J43.9). Electronically Signed   By: Iven Finn M.D.   On: 12/29/2022 02:28    Microbiology: Results for orders placed or performed during the hospital encounter of 01/22/23  Resp panel by RT-PCR (RSV, Flu A&B, Covid) Anterior Nasal Swab     Status: None   Collection Time: 01/22/23  1:55 PM   Specimen: Anterior Nasal Swab  Result Value Ref Range Status   SARS Coronavirus 2 by RT PCR NEGATIVE NEGATIVE Final    Comment: (NOTE) SARS-CoV-2 target nucleic acids are NOT DETECTED.  The SARS-CoV-2 RNA is generally detectable in upper respiratory specimens during the acute phase of infection. The lowest concentration of SARS-CoV-2 viral copies this assay can detect is 138 copies/mL. A negative result does not preclude SARS-Cov-2 infection and should not be used as the sole basis for treatment or other patient management decisions. A negative result may occur with  improper specimen collection/handling, submission of specimen other than nasopharyngeal swab, presence of viral mutation(s) within the areas targeted by this assay, and inadequate number of viral copies(<138 copies/mL). A negative result must be combined with clinical observations, patient history, and epidemiological information. The expected result is Negative.  Fact Sheet for Patients:  EntrepreneurPulse.com.au  Fact Sheet for Healthcare Providers:   IncredibleEmployment.be  This test is no t yet approved or cleared by the [place] FDA and  has been authorized for detection and/or diagnosis of SARS-CoV-2 by FDA under an Emergency Use Authorization (EUA). This EUA will remain  in effect (meaning this test can be used) for the duration of the COVID-19 declaration under Section 564(b)(1) of the Act, 21 U.S.C.section 360bbb-3(b)(1), unless the authorization is terminated  or revoked sooner.       Influenza A by PCR NEGATIVE NEGATIVE Final   Influenza B by PCR NEGATIVE NEGATIVE Final    Comment: (NOTE) The Xpert Xpress SARS-CoV-2/FLU/RSV plus assay is intended as an aid in the diagnosis of influenza from Nasopharyngeal swab specimens and should not be used as a sole basis for treatment. Nasal washings and aspirates are unacceptable for Xpert Xpress SARS-CoV-2/FLU/RSV testing.  Fact Sheet for Patients: EntrepreneurPulse.com.au  Fact Sheet for Healthcare Providers: IncredibleEmployment.be  This test is not yet approved or cleared by the [place] FDA and has been authorized for detection and/or diagnosis of SARS-CoV-2 by FDA under an Emergency Use Authorization (EUA). This EUA will remain in effect (meaning this test can be used) for the duration of the COVID-19 declaration under Section 564(b)(1) of the Act, 21 U.S.C. section 360bbb-3(b)(1), unless the authorization is terminated or revoked.     Resp Syncytial Virus by PCR NEGATIVE NEGATIVE Final    Comment: (NOTE) Fact Sheet for  Patients: EntrepreneurPulse.com.au  Fact Sheet for Healthcare Providers: IncredibleEmployment.be  This test is not yet approved or cleared by the [place] FDA and has been authorized for detection and/or diagnosis of SARS-CoV-2 by FDA under an Emergency Use Authorization (EUA). This EUA will remain in effect (meaning this test can be used) for  the duration of the COVID-19 declaration under Section 564(b)(1) of the Act, 21 U.S.C. section 360bbb-3(b)(1), unless the authorization is terminated or revoked.  Performed at [facility], [address] [place], [place] [address]     Labs: CBC: Recent Labs  Lab 01/22/23 1205 01/22/23 1836 01/23/23 0418  WBC 4.1  --  3.5*  NEUTROABS 1.0*  --   --   HGB 6.1* 7.1* 6.3*  HCT 18.8* 20.6* 18.5*  MCV 97.4  --  92.5  PLT 162  --  [id number]*   Basic Metabolic Panel: Recent Labs  Lab 01/22/23 1205 01/23/23 0418  NA 132* 134*  K 3.6 3.2*  CL 100 103  CO2 21* 24  GLUCOSE 156* 122*  BUN 16 14  CREATININE 0.90 0.79  CALCIUM 8.5* 8.2*   Liver Function Tests: Recent Labs  Lab 01/22/23 1205  AST 69*  ALT 65*  ALKPHOS 92  BILITOT 2.1*  PROT 7.1  ALBUMIN 2.7*   CBG: No results for input(s): "GLUCAP" in the last 168 hours.  Discharge time spent: greater than 30 minutes.  Signed: Sharen Hones, MD [facility] 01/23/2023

## 2023-01-23 NOTE — Progress Notes (Signed)
Palliative Care Progress Note, Assessment & Plan   Patient Name: Derek Haney       Date: 01/23/2023 DOB: Nov 21, 1932  Age: 89 y.o. MRN#: [id number] Attending Physician: Sharen Hones, MD Primary Care Physician: Rusty Aus, MD Admit Date: 01/22/2023  Subjective: Patient is lying in bed in no apparent distress.  He acknowledges my presence and is able to make his wishes known.  His wife, 2 sons, and daughter-in-law are present at bedside.  Patient continues to have complaints of a dry painful cough.  He endorses that use of Tussionex overnight helped.  HPI: 89 y.o. male  with past medical history of moderate COPD, HTN, type 2 diabetes, GERD, CHF, CAD, glaucoma, MI, and anemia admitted on 01/22/2023 with weakness.   On admission, hemoglobin was 6.2.  GI was consulted but did not recommend any additional workup as patient had EGD and colonoscopy with no active bleeding source.   Patient had outpatient bone marrow biopsy and results revealed hypercellular bone marrow with features of an evolving acute myeloid leukemia with circulating blasts. Patient made aware of these results during admission process and patient/family met with onc Dr. Janese Banks 3/5.    PMT was consulted to discuss goals and boundaries of care.  Summary of counseling/coordination of care: After reviewing the patient's chart and assessing the patient at bedside, I discussed plan and goals of care with patient and family.  Symptoms assessed.  Patient continues to endorse a dry and painful cough.  He endorses that Tussionex helped overnight.  Tussionex scheduled every 12.  Also encourage patient to use separate call lozenges for dry mouth and throat.  I discussed next steps for patient.  They share understanding from oncology that patient's prognosis  is days to weeks.  He is not a candidate for aggressive treatments. Patient and family in agreement to shift to full comfort care.   Patient and family in agreement to focus on dignified and compassionate end-of-life care.  Comfort measures described in detail.  Adjustments made to [facility]. DNR remains.   Full comfort measures in place.  Chaplaincy offered and family declined.  Counseled with Dr. Roosevelt Locks and TOC.  Order placed for [facility] to offer hospice choice for inpatient evaluation and placement.  PMT will continue to monitor.  Physical Exam Vitals reviewed.  Constitutional:      General: He is not in acute distress.    Appearance: He is normal weight. He is ill-appearing.  HENT:     Head: Normocephalic.     Mouth/Throat:     Mouth: Mucous membranes are moist.  Eyes:     Pupils: Pupils are equal, round, and reactive to light.  Cardiovascular:     Rate and Rhythm: Normal rate.     Pulses: Normal pulses.  Pulmonary:     Breath sounds: Wheezing present.  Abdominal:     Palpations: Abdomen is soft.  Musculoskeletal:     Comments: Generalized weakness  Skin:    General: Skin is warm and dry.     Coloration: Skin is pale.  Neurological:     Mental Status: He is alert and oriented to person, place, and time.  Psychiatric:        Mood  and Affect: Mood normal.        Behavior: Behavior normal.        Thought Content: Thought content normal.        Judgment: Judgment normal.             Total Time 50 minutes   Krystal Teachey L. Ilsa Iha, FNP-BC Palliative Medicine Team Team Phone # [phone]

## 2023-01-23 NOTE — Progress Notes (Signed)
  Progress Note   Patient: Derek Haney [id number] DOB: 1932/11/20 DOA: 01/22/2023     1 DOS: the patient was seen and examined on 01/23/2023   Brief hospital course: [name] is a 89 year old male with history of acute myeloid leukemia, hypertension, hyperlipidemia, COPD, GERD, failure to thrive, frequent severe acute on chronic anemia requiring transfusion, who presents emergency department for chief concerns of weakness and anemia. Patient received 1 unit PRBC transfusion, hemoglobin dropped down to 6.3 again.  Patient has been seen by oncology, condition is terminal, recommended transition to hospice care. Discussion with the family and the patient, transition to comfort care.   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  Discussed with TOC, will looking for inpatient hospice placement.  CAD (coronary artery disease) Benign essential hypertension HLD (hyperlipidemia) No additional treatment.  CAP (community acquired pneumonia) No additional treatment.  Failure to thrive in adult        Subjective:  Patient feels tired, sleepy.  Physical Exam: Vitals:   01/22/23 2047 01/23/23 0018 01/23/23 0440 01/23/23 0759  BP: (!) 119/46 (!) 104/43 (!) 98/53 (!) 103/54  Pulse: 82 72 66 64  Resp: '18 18 18 16  '$ Temp: 98.7 F (37.1 C) 97.7 F (36.5 C) (!) 97.5 F (36.4 C) 98.6 F (37 C)  TempSrc: Oral Oral Oral   SpO2: 96% 95% 95%  98%  Weight:      Height:       General exam: Appears pale and chronically ill. Respiratory system: Clear to auscultation. Respiratory effort normal. Cardiovascular system: S1 & S2 heard, RRR. No JVD, murmurs, rubs, gallops or clicks. No pedal edema. Gastrointestinal system: Abdomen is nondistended, soft and nontender. No organomegaly or masses felt. Normal bowel sounds heard. Central nervous system: Drawsy and oriented x2. No focal neurological deficits. Extremities: Symmetric 5 x 5 power. Skin: No rashes, lesions or ulcers Psychiatry: Mood & affect appropriate.    Data Reviewed:  Lab results reviewed.  Family Communication: Met with multiple family members.  Disposition: Status is: Inpatient Remains inpatient appropriate because: Comfort care.     Time spent: 50 minutes  Author: Sharen Hones, MD 01/23/2023 11:05 AM  For on call review [email or web address].

## 2023-01-23 NOTE — TOC Transition Note (Addendum)
Transition of Care [facility]) - CM/SW Discharge Note   Patient Details  Name: Derek Haney MRN: [id number] Date of Birth: [birth year]  Transition of Care [facility]) CM/SW Contact:  Gerilyn Pilgrim, LCSW Phone Number: 01/23/2023, 4:19 PM   Clinical Narrative:  Pt has been accepted at [facility]. [facility] to arrange transport for patient at discharge.Medical Necessity printed to unit.  No additional needs. CSW signing off.            Patient Goals and CMS Choice      Discharge Placement                         Discharge Plan and Services Additional resources added to the After Visit Summary for                                       Social Determinants of Health (SDOH) Interventions SDOH Screenings   Food Insecurity: No Food Insecurity (01/22/2023)  Housing: Low Risk  (01/22/2023)  Transportation Needs: No Transportation Needs (01/22/2023)  Utilities: Not At Risk (01/22/2023)  Tobacco Use: Medium Risk (01/22/2023)     Readmission Risk Interventions     No data to display

## 2023-01-23 NOTE — Progress Notes (Signed)
Nutrition Brief Note  Chart reviewed. Pt now transitioning to comfort care. Per oncology notes, pt is not a candidate for treatments and plans to be discharge home with hospice services.  No further nutrition interventions planned at this time.  Please re-consult as needed.   Loistine Chance, RD, LDN, Protection Registered Dietitian II Certified Diabetes Care and Education Specialist Please refer to [facility] for RD and/or RD on-call/weekend/after hours pager

## 2023-01-23 NOTE — TOC Progression Note (Signed)
Transition of Care [facility]) - Progression Note    Patient Details  Name: Derek Haney MRN: [id number] Date of Birth: 01/21/1933  Transition of Care [facility]) CM/SW Contact  Gerilyn Pilgrim, LCSW Phone Number: 01/23/2023, 2:37 PM  Clinical Narrative:  SW spoke with patients son [name] who reports he and his family had discussed going to [facility]. CSW made referral to [facility]. TOC to continue to follow.           Expected Discharge Plan and Services                                               Social Determinants of Health (SDOH) Interventions SDOH Screenings   Food Insecurity: No Food Insecurity (01/22/2023)  Housing: Low Risk  (01/22/2023)  Transportation Needs: No Transportation Needs (01/22/2023)  Utilities: Not At Risk (01/22/2023)  Tobacco Use: Medium Risk (01/22/2023)    Readmission Risk Interventions     No data to display

## 2023-01-23 NOTE — Progress Notes (Signed)
Manufacturing systems engineer Liaison Referral Note  Received request from Tyrone Apple, SW, [facility] for hospice services the [facility] after discharge. Spoke with patient, his son [name] and Lynden Oxford at the bedside  to initiate education related to hospice philosophy, services, and team approach to care. Patient/family verbalized understanding of information given.   Per Dr. Antonieta Loveless, hospice MD, patient has been approved for Hospice Home.  Bed offer made and accepted by patient and family.  RN to call report to hospice home at [phone]. This RN will set up transport via EMS.  Please send signed and completed DNR  with the patient at discharge.  Lonia Chimera information and contact numbers given to Julianne Handler, son. Above information shared with Gerilyn Pilgrim, SW, TOC and team at [facility].      Please call with any hospice related questions or concerns. Thank you for the opportunity to participate in this patient's care.  Dimas Aguas, RN Nurse Liaison [phone]

## 2023-01-23 NOTE — Progress Notes (Signed)
Patient discharged via EMS, all belongings sent with patient. Peripheral Ivs left in place for Hospice House.

## 2023-01-25 ENCOUNTER — Encounter (HOSPITAL_COMMUNITY): Payer: Self-pay | Admitting: Oncology

## 2023-02-01 LAB — INTELLIGEN MYELOID

## 2023-02-18 DEATH — deceased

## 2023-03-29 ENCOUNTER — Other Ambulatory Visit: Payer: Self-pay

## 2023-04-01 ENCOUNTER — Ambulatory Visit: Payer: Medicare PPO | Admitting: Gastroenterology
